# Patient Record
Sex: Male | Born: 1975 | Race: White | Hispanic: No | Marital: Married | Smoking: Never smoker
Health system: Southern US, Community
[De-identification: ages and names within clinical notes are randomized; demographics above are authoritative.]

## PROBLEM LIST (undated history)

## (undated) HISTORY — PX: ANTERIOR CRUCIATE LIGAMENT REPAIR: SHX115

---

## 1994-02-15 HISTORY — PX: ANTERIOR CRUCIATE LIGAMENT REPAIR: SHX115

## 2007-10-03 ENCOUNTER — Emergency Department (HOSPITAL_COMMUNITY): Admission: EM | Admit: 2007-10-03 | Discharge: 2007-10-03 | Payer: Self-pay | Admitting: Emergency Medicine

## 2015-12-25 ENCOUNTER — Emergency Department (HOSPITAL_COMMUNITY): Payer: Self-pay

## 2015-12-25 ENCOUNTER — Emergency Department (HOSPITAL_COMMUNITY)
Admission: EM | Admit: 2015-12-25 | Discharge: 2015-12-25 | Disposition: A | Discharge status: Home / Self Care | Payer: Self-pay | Attending: Emergency Medicine | Admitting: Emergency Medicine

## 2015-12-25 ENCOUNTER — Encounter (HOSPITAL_COMMUNITY): Payer: Self-pay

## 2015-12-25 DIAGNOSIS — Z791 Long term (current) use of non-steroidal anti-inflammatories (NSAID): Secondary | ICD-10-CM | POA: Insufficient documentation

## 2015-12-25 DIAGNOSIS — F17220 Nicotine dependence, chewing tobacco, uncomplicated: Secondary | ICD-10-CM | POA: Insufficient documentation

## 2015-12-25 DIAGNOSIS — Z79899 Other long term (current) drug therapy: Secondary | ICD-10-CM | POA: Insufficient documentation

## 2015-12-25 DIAGNOSIS — R1013 Epigastric pain: Secondary | ICD-10-CM | POA: Insufficient documentation

## 2015-12-25 LAB — URINALYSIS, ROUTINE W REFLEX MICROSCOPIC
BILIRUBIN URINE: NEGATIVE
Glucose, UA: NEGATIVE mg/dL
Ketones, ur: NEGATIVE mg/dL
Leukocytes, UA: NEGATIVE
NITRITE: NEGATIVE
PROTEIN: NEGATIVE mg/dL
pH: 5.5 (ref 5.0–8.0)

## 2015-12-25 LAB — URINE MICROSCOPIC-ADD ON: Bacteria, UA: NONE SEEN

## 2015-12-25 LAB — COMPREHENSIVE METABOLIC PANEL
ALBUMIN: 3.9 g/dL (ref 3.5–5.0)
ALT: 28 U/L (ref 17–63)
ANION GAP: 6 (ref 5–15)
AST: 20 U/L (ref 15–41)
Alkaline Phosphatase: 63 U/L (ref 38–126)
BUN: 21 mg/dL — ABNORMAL HIGH (ref 6–20)
CALCIUM: 9.1 mg/dL (ref 8.9–10.3)
CO2: 28 mmol/L (ref 22–32)
Chloride: 103 mmol/L (ref 101–111)
Creatinine, Ser: 0.94 mg/dL (ref 0.61–1.24)
GFR calc non Af Amer: >60 mL/min (ref >60)
GLUCOSE: 106 mg/dL — AB (ref 65–99)
POTASSIUM: 4 mmol/L (ref 3.5–5.1)
SODIUM: 137 mmol/L (ref 135–145)
Total Bilirubin: 0.4 mg/dL (ref 0.3–1.2)
Total Protein: 7.4 g/dL (ref 6.5–8.1)

## 2015-12-25 LAB — CBC
HEMATOCRIT: 44 % (ref 39.0–52.0)
HEMOGLOBIN: 14.8 g/dL (ref 13.0–17.0)
MCH: 29.8 pg (ref 26.0–34.0)
MCHC: 33.6 g/dL (ref 30.0–36.0)
MCV: 88.5 fL (ref 78.0–100.0)
Platelets: 388 10*3/uL (ref 150–400)
RBC: 4.97 MIL/uL (ref 4.22–5.81)
RDW: 14.1 % (ref 11.5–15.5)
WBC: 13.8 10*3/uL — ABNORMAL HIGH (ref 4.0–10.5)

## 2015-12-25 LAB — POC OCCULT BLOOD, ED: FECAL OCCULT BLD: POSITIVE — AB

## 2015-12-25 LAB — TYPE AND SCREEN
ABO/RH(D): A POS
ANTIBODY SCREEN: NEGATIVE

## 2015-12-25 MED ORDER — IOPAMIDOL (ISOVUE-300) INJECTION 61%
INTRAVENOUS | Status: AC
  Administered 2015-12-25: 30 mL
  Filled 2015-12-25: qty 30

## 2015-12-25 MED ORDER — PANTOPRAZOLE SODIUM 20 MG PO TBEC: 20.0000 mg | DELAYED_RELEASE_TABLET | Freq: Every day | ORAL | 0 refills | Status: AC

## 2015-12-25 MED ORDER — PANTOPRAZOLE SODIUM 40 MG IV SOLR
40.0000 mg | Freq: Once | INTRAVENOUS | Status: AC
  Administered 2015-12-25: 40 mg via INTRAVENOUS
  Filled 2015-12-25: qty 40

## 2015-12-25 MED ORDER — GI COCKTAIL ~~LOC~~
30.0000 mL | Freq: Once | ORAL | Status: AC
  Administered 2015-12-25: 30 mL via ORAL
  Filled 2015-12-25: qty 30

## 2015-12-25 MED ORDER — SUCRALFATE 1 GM/10ML PO SUSP
1.0000 g | Freq: Once | ORAL | Status: AC
  Administered 2015-12-25: 1 g via ORAL
  Filled 2015-12-25: qty 10

## 2015-12-25 MED ORDER — IOPAMIDOL (ISOVUE-300) INJECTION 61%
100.0000 mL | Freq: Once | INTRAVENOUS | Status: AC | PRN
  Administered 2015-12-25: 100 mL via INTRAVENOUS

## 2015-12-25 MED ORDER — FAMOTIDINE IN NACL 20-0.9 MG/50ML-% IV SOLN
20.0000 mg | Freq: Once | INTRAVENOUS | Status: AC
  Administered 2015-12-25: 20 mg via INTRAVENOUS
  Filled 2015-12-25: qty 50

## 2015-12-25 NOTE — ED Notes (Addendum)
RN explained to pt and family rationale for discharge and why he did not meet inpatient criteria. Follow up resources given and pt instructed to call hospital main line and ask to speak with congregational nurse or case management for further assistance.

## 2015-12-25 NOTE — ED Provider Notes (Signed)
[facility] DEPT Provider Note   CSN: [id number] Arrival date & time: 12/25/15  1120     History   Chief Complaint Chief Complaint  Patient presents with  . Abdominal Pain    HPI Johnathan Johnson is a 40 y.o. male.  HPI  Pt was seen at 1450.  Per pt, c/o gradual onset and persistence of constant upper abd "pain" for the past 2+ months, worse over the past 2 weeks. Pt states for the past 1 week he has had multiple intermittent episodes of "diarrhea;" "4-6 stools" per day. States the past 2 days his stools have been "black." States he took "3 left over amoxicillin tablets" for a toothache spread out over the past several weeks. Pt also states he has been "taking a lot of 800mg  motrin" for his dental pain for the past 1-2 weeks.  Denies N/V, no fevers, no back pain, no rash, no CP/SOB, no blood in stools.        History reviewed. No pertinent past medical history.  There are no active problems to display for this patient.   Past Surgical History:  Procedure Laterality Date  . ANTERIOR CRUCIATE LIGAMENT REPAIR        Home Medications    Prior to Admission medications   Medication Sig Start Date End Date Taking? Authorizing Provider  diclofenac (VOLTAREN) 25 MG EC tablet Take 25 mg by mouth 2 (two) times daily.   Yes Historical Provider, MD  HYDROcodone-acetaminophen (NORCO) 7.5-325 MG tablet Take 1 tablet by mouth every 6 (six) hours as needed for moderate pain.   Yes Historical Provider, MD  ibuprofen (ADVIL,MOTRIN) 800 MG tablet Take 800 mg by mouth every 8 (eight) hours as needed.   Yes Historical Provider, MD  naproxen sodium (ANAPROX) 220 MG tablet Take 220 mg by mouth 2 (two) times daily with a meal.   Yes Historical Provider, MD    Family History No family history on file.  Social History Social History  Substance Use Topics  . Smoking status: Never Smoker  . Smokeless tobacco: Current User    Types: Chew  . Alcohol use No     Allergies   Haldol  [haloperidol lactate]   Review of Systems Review of Systems ROS: Statement: All systems negative except as marked or noted in the HPI; Constitutional: Negative for fever and chills. ; ; Eyes: Negative for eye pain, redness and discharge. ; ; ENMT: Negative for ear pain, hoarseness, nasal congestion, sinus pressure and sore throat. ; ; Cardiovascular: Negative for chest pain, palpitations, diaphoresis, dyspnea and peripheral edema. ; ; Respiratory: Negative for cough, wheezing and stridor. ; ; Gastrointestinal: +"black stools," diarrhea, abd pain. Negative for nausea, vomiting, blood in stool, hematemesis, jaundice and rectal bleeding. . ; ; Genitourinary: Negative for dysuria, flank pain and hematuria. ; ; Musculoskeletal: Negative for back pain and neck pain. Negative for swelling and trauma.; ; Skin: Negative for pruritus, rash, abrasions, blisters, bruising and skin lesion.; ; Neuro: Negative for headache, lightheadedness and neck stiffness. Negative for weakness, altered level of consciousness, altered mental status, extremity weakness, paresthesias, involuntary movement, seizure and syncope.       Physical Exam Updated Vital Signs BP 154/92   Pulse 100   Temp 98.5 F (36.9 C)   Resp 16   Ht 5\' 11"  (1.803 m)   Wt 250 lb (113.4 kg)   SpO2 99%   BMI 34.87 kg/m   Physical Exam 1455: Physical examination:  Nursing notes reviewed; Vital signs  and O2 SAT reviewed;  Constitutional: Well developed, Well nourished, Well hydrated, In no acute distress; Head:  Normocephalic, atraumatic; Eyes: EOMI, PERRL, No scleral icterus; ENMT: Mouth and pharynx normal, Mucous membranes moist; Neck: Supple, Full range of motion, No lymphadenopathy; Cardiovascular: Regular rate and rhythm, No murmur, rub, or gallop; Respiratory: Breath sounds clear & equal bilaterally, No rales, rhonchi, wheezes.  Speaking full sentences with ease, Normal respiratory effort/excursion; Chest: Nontender, Movement normal; Abdomen:  Soft, +mid-epigastric tenderness to palp. No rebound or guarding. Nondistended, Normal bowel sounds. Rectal exam performed w/permission of pt and ED RN chaperone present.  Anal tone normal.  Non-tender, soft brown stool in rectal vault, heme positive.  No fissures, no external hemorrhoids, no palp masses.; Genitourinary: No CVA tenderness; Extremities: Pulses normal, No tenderness, No edema, No calf edema or asymmetry.; Neuro: AA&Ox3, Major CN grossly intact.  Speech clear. No gross focal motor or sensory deficits in extremities.; Skin: Color normal, Warm, Dry.   ED Treatments / Results  Labs (all labs ordered are listed, but only abnormal results are displayed)   EKG  EKG Interpretation None       Radiology   Procedures Procedures (including critical care time)  Medications Ordered in ED Medications  famotidine (PEPCID) IVPB 20 mg premix (not administered)  pantoprazole (PROTONIX) injection 40 mg (not administered)     Initial Impression / Assessment and Plan / ED Course  I have reviewed the triage vital signs and the nursing notes.  Pertinent labs & imaging results that were available during my care of the patient were reviewed by me and considered in my medical decision making (see chart for details).  MDM Reviewed: previous chart, nursing note and vitals Reviewed previous: labs Interpretation: labs, x-ray and CT scan    Results for orders placed or performed during the hospital encounter of 12/25/15  Comprehensive metabolic panel  Result Value Ref Range   Sodium 137 135 - 145 mmol/L   Potassium 4.0 3.5 - 5.1 mmol/L   Chloride 103 101 - 111 mmol/L   CO2 28 22 - 32 mmol/L   Glucose, Bld 106 (H) 65 - 99 mg/dL   BUN 21 (H) 6 - 20 mg/dL   Creatinine, Ser 1.61 0.61 - 1.24 mg/dL   Calcium 9.1 8.9 - 09.6 mg/dL   Total Protein 7.4 6.5 - 8.1 g/dL   Albumin 3.9 3.5 - 5.0 g/dL   AST 20 15 - 41 U/L   ALT 28 17 - 63 U/L   Alkaline Phosphatase 63 38 - 126 U/L   Total  Bilirubin 0.4 0.3 - 1.2 mg/dL   GFR calc non Af Amer >60 >60 mL/min   GFR calc Af Amer >60 >60 mL/min   Anion gap 6 5 - 15  CBC  Result Value Ref Range   WBC 13.8 (H) 4.0 - 10.5 K/uL   RBC 4.97 4.22 - 5.81 MIL/uL   Hemoglobin 14.8 13.0 - 17.0 g/dL   HCT 04.5 40.9 - 81.1 %   MCV 88.5 78.0 - 100.0 fL   MCH 29.8 26.0 - 34.0 pg   MCHC 33.6 30.0 - 36.0 g/dL   RDW 91.4 78.2 - 95.6 %   Platelets 388 150 - 400 K/uL  Urinalysis, Routine w reflex microscopic (not at [facility])  Result Value Ref Range   Color, Urine YELLOW YELLOW   APPearance CLEAR CLEAR   Specific Gravity, Urine >1.030 (H) 1.005 - 1.030   pH 5.5 5.0 - 8.0   Glucose, UA NEGATIVE NEGATIVE  mg/dL   Hgb urine dipstick TRACE (A) NEGATIVE   Bilirubin Urine NEGATIVE NEGATIVE   Ketones, ur NEGATIVE NEGATIVE mg/dL   Protein, ur NEGATIVE NEGATIVE mg/dL   Nitrite NEGATIVE NEGATIVE   Leukocytes, UA NEGATIVE NEGATIVE  Urine microscopic-add on  Result Value Ref Range   Squamous Epithelial / LPF 0-5 (A) NONE SEEN   WBC, UA 0-5 0 - 5 WBC/hpf   RBC / HPF 0-5 0 - 5 RBC/hpf   Bacteria, UA NONE SEEN NONE SEEN  POC occult blood, ED  Result Value Ref Range   Fecal Occult Bld POSITIVE (A) NEGATIVE  Type and screen [facility]  Result Value Ref Range   ABO/RH(D) A POS    Antibody Screen NEG    Sample Expiration 12/28/2015    Dg Chest 2 View Result Date: 12/25/2015 CLINICAL DATA:  Abdominal pain. EXAM: CHEST  2 VIEW COMPARISON:  No recent prior. FINDINGS: Mediastinum and hilar structures are normal. No focal infiltrate. No pleural effusion pneumothorax. Heart size normal. No acute bony abnormality. IMPRESSION: No acute cardiopulmonary disease. Electronically Signed   By: Maisie Fushomas  Register   On: 12/25/2015 16:00   Ct Abdomen Pelvis W Contrast Result Date: 12/25/2015 CLINICAL DATA:  Upper abdominal pain for 1 month EXAM: CT ABDOMEN AND PELVIS WITH CONTRAST TECHNIQUE: Multidetector CT imaging of the abdomen and pelvis was performed using  the standard protocol following bolus administration of intravenous contrast. CONTRAST:  30mL ISOVUE-300 IOPAMIDOL (ISOVUE-300) INJECTION 61%, 100mL ISOVUE-300 IOPAMIDOL (ISOVUE-300) INJECTION 61% COMPARISON:  None. FINDINGS: Lower chest: No acute abnormality. Hepatobiliary: Few scattered cysts are noted within the liver. The gallbladder is within normal limits. Pancreas: Unremarkable. No pancreatic ductal dilatation or surrounding inflammatory changes. Spleen: Normal in size without focal abnormality. Adrenals/Urinary Tract: Adrenal glands are unremarkable. Kidneys are normal, without renal calculi, focal lesion, or hydronephrosis. Bladder is unremarkable. Stomach/Bowel: Stomach is within normal limits. Appendix appears normal. No evidence of bowel wall thickening, distention, or inflammatory changes. Vascular/Lymphatic: Aortic atherosclerosis. No enlarged abdominal or pelvic lymph nodes. Reproductive: Prostate is unremarkable. Other: No abdominal wall hernia or abnormality. No abdominopelvic ascites. Musculoskeletal: No acute or significant osseous findings. IMPRESSION: No acute abnormality noted. Electronically Signed   By: Alcide CleverMark  Lukens M.D.   On: 12/25/2015 16:26    1715:  Stool heme positive, H/H and rest of workup reassuring. Tx symptomatically, f/u GI MD. Dx and testing d/w pt and family.  Questions answered.  Verb understanding, agreeable to d/c home with outpt f/u.    Final Clinical Impressions(s) / ED Diagnoses   Final diagnoses:  None    New Prescriptions New Prescriptions   No medications on file     Samuel JesterKathleen Ark Agrusa, DO 12/28/15 1834

## 2015-12-25 NOTE — ED Triage Notes (Signed)
Pt reports upper abd pain for the past month.  Reports was on amoxicillin 2 weeks ago for a tooth infection and started having diarrhea for the past week.  Reports has had 4-6 loose stools per day and reports black stools for the past 2 days.

## 2015-12-25 NOTE — Discharge Instructions (Signed)
Eat a bland diet, avoiding greasy, fatty, fried foods, as well as spicy and acidic foods or beverages.  Avoid eating within the hour or 2 before going to bed or laying down.  Avoid aspirin and ibuprofen products. Also avoid teas, colas, coffee, chocolate, pepermint and spearment.  Take over the counter pepcid, one or two tablets by mouth twice a day, for the next 3 to 4 weeks.  May also take over the counter maalox/mylanta, as directed on packaging, as needed for discomfort.  Take the prescription as directed.  Call the GI doctor tomorrow to schedule a follow up appointment within the next week.  Return to the Emergency Department immediately if worsening.

## 2021-01-22 ENCOUNTER — Emergency Department (HOSPITAL_COMMUNITY): Payer: Self-pay

## 2021-01-22 ENCOUNTER — Other Ambulatory Visit: Payer: Self-pay

## 2021-01-22 ENCOUNTER — Encounter (HOSPITAL_COMMUNITY): Payer: Self-pay | Admitting: Emergency Medicine

## 2021-01-22 ENCOUNTER — Inpatient Hospital Stay (HOSPITAL_COMMUNITY): Payer: Self-pay

## 2021-01-22 ENCOUNTER — Inpatient Hospital Stay (HOSPITAL_COMMUNITY)
Admission: EM | Admit: 2021-01-22 | Discharge: 2021-01-25 | DRG: 872 | Disposition: A | Discharge status: Home / Self Care | Payer: Self-pay | Attending: Family Medicine | Admitting: Family Medicine

## 2021-01-22 DIAGNOSIS — L0390 Cellulitis, unspecified: Secondary | ICD-10-CM | POA: Diagnosis present

## 2021-01-22 DIAGNOSIS — S83249A Other tear of medial meniscus, current injury, unspecified knee, initial encounter: Secondary | ICD-10-CM

## 2021-01-22 DIAGNOSIS — Z79899 Other long term (current) drug therapy: Secondary | ICD-10-CM

## 2021-01-22 DIAGNOSIS — R6510 Systemic inflammatory response syndrome (SIRS) of non-infectious origin without acute organ dysfunction: Secondary | ICD-10-CM

## 2021-01-22 DIAGNOSIS — K5900 Constipation, unspecified: Secondary | ICD-10-CM | POA: Diagnosis present

## 2021-01-22 DIAGNOSIS — D649 Anemia, unspecified: Secondary | ICD-10-CM

## 2021-01-22 DIAGNOSIS — L02416 Cutaneous abscess of left lower limb: Secondary | ICD-10-CM | POA: Diagnosis present

## 2021-01-22 DIAGNOSIS — F17210 Nicotine dependence, cigarettes, uncomplicated: Secondary | ICD-10-CM | POA: Diagnosis present

## 2021-01-22 DIAGNOSIS — E441 Mild protein-calorie malnutrition: Secondary | ICD-10-CM | POA: Diagnosis present

## 2021-01-22 DIAGNOSIS — Z888 Allergy status to other drugs, medicaments and biological substances status: Secondary | ICD-10-CM

## 2021-01-22 DIAGNOSIS — K219 Gastro-esophageal reflux disease without esophagitis: Secondary | ICD-10-CM | POA: Diagnosis present

## 2021-01-22 DIAGNOSIS — Z8249 Family history of ischemic heart disease and other diseases of the circulatory system: Secondary | ICD-10-CM

## 2021-01-22 DIAGNOSIS — A419 Sepsis, unspecified organism: Principal | ICD-10-CM

## 2021-01-22 DIAGNOSIS — L03116 Cellulitis of left lower limb: Secondary | ICD-10-CM

## 2021-01-22 DIAGNOSIS — E8809 Other disorders of plasma-protein metabolism, not elsewhere classified: Secondary | ICD-10-CM | POA: Diagnosis present

## 2021-01-22 DIAGNOSIS — F17200 Nicotine dependence, unspecified, uncomplicated: Secondary | ICD-10-CM

## 2021-01-22 DIAGNOSIS — Z20822 Contact with and (suspected) exposure to covid-19: Secondary | ICD-10-CM | POA: Diagnosis present

## 2021-01-22 DIAGNOSIS — E871 Hypo-osmolality and hyponatremia: Secondary | ICD-10-CM

## 2021-01-22 LAB — COMPREHENSIVE METABOLIC PANEL
ALT: 12 U/L (ref 0–44)
ALT: 10 U/L (ref 0–44)
AST: 15 U/L (ref 15–41)
AST: 12 U/L — ABNORMAL LOW (ref 15–41)
Albumin: 3.2 g/dL — ABNORMAL LOW (ref 3.5–5.0)
Albumin: 2.6 g/dL — ABNORMAL LOW (ref 3.5–5.0)
Alkaline Phosphatase: 64 U/L (ref 38–126)
Alkaline Phosphatase: 52 U/L (ref 38–126)
Anion gap: 10 (ref 5–15)
Anion gap: 6 (ref 5–15)
BUN: 14 mg/dL (ref 6–20)
BUN: 12 mg/dL (ref 6–20)
CO2: 24 mmol/L (ref 22–32)
CO2: 27 mmol/L (ref 22–32)
Calcium: 8 mg/dL — ABNORMAL LOW (ref 8.9–10.3)
Calcium: 7.8 mg/dL — ABNORMAL LOW (ref 8.9–10.3)
Chloride: 98 mmol/L (ref 98–111)
Chloride: 105 mmol/L (ref 98–111)
Creatinine, Ser: 0.88 mg/dL (ref 0.61–1.24)
Creatinine, Ser: 0.95 mg/dL (ref 0.61–1.24)
GFR, Estimated: >60 mL/min (ref >60)
GFR, Estimated: >60 mL/min (ref >60)
Glucose, Bld: 131 mg/dL — ABNORMAL HIGH (ref 70–99)
Glucose, Bld: 126 mg/dL — ABNORMAL HIGH (ref 70–99)
Potassium: 3.7 mmol/L (ref 3.5–5.1)
Potassium: 3.6 mmol/L (ref 3.5–5.1)
Sodium: 132 mmol/L — ABNORMAL LOW (ref 135–145)
Sodium: 138 mmol/L (ref 135–145)
Total Bilirubin: 1.2 mg/dL (ref 0.3–1.2)
Total Bilirubin: 1.1 mg/dL (ref 0.3–1.2)
Total Protein: 7.1 g/dL (ref 6.5–8.1)
Total Protein: 5.9 g/dL — ABNORMAL LOW (ref 6.5–8.1)

## 2021-01-22 LAB — CBC WITH DIFFERENTIAL/PLATELET
Abs Immature Granulocytes: 0.1 10*3/uL — ABNORMAL HIGH (ref 0.00–0.07)
Abs Immature Granulocytes: 0.14 10*3/uL — ABNORMAL HIGH (ref 0.00–0.07)
Basophils Absolute: 0.1 10*3/uL (ref 0.0–0.1)
Basophils Absolute: 0.1 10*3/uL (ref 0.0–0.1)
Basophils Relative: 0 %
Basophils Relative: 0 %
Eosinophils Absolute: 0 10*3/uL (ref 0.0–0.5)
Eosinophils Absolute: 0 10*3/uL (ref 0.0–0.5)
Eosinophils Relative: 0 %
Eosinophils Relative: 0 %
HCT: 35.5 % — ABNORMAL LOW (ref 39.0–52.0)
HCT: 31.3 % — ABNORMAL LOW (ref 39.0–52.0)
Hemoglobin: 11.6 g/dL — ABNORMAL LOW (ref 13.0–17.0)
Hemoglobin: 9.8 g/dL — ABNORMAL LOW (ref 13.0–17.0)
Immature Granulocytes: 1 %
Immature Granulocytes: 1 %
Lymphocytes Relative: 12 %
Lymphocytes Relative: 13 %
Lymphs Abs: 2.3 10*3/uL (ref 0.7–4.0)
Lymphs Abs: 2.4 10*3/uL (ref 0.7–4.0)
MCH: 28.6 pg (ref 26.0–34.0)
MCH: 27.8 pg (ref 26.0–34.0)
MCHC: 32.7 g/dL (ref 30.0–36.0)
MCHC: 31.3 g/dL (ref 30.0–36.0)
MCV: 87.7 fL (ref 80.0–100.0)
MCV: 88.9 fL (ref 80.0–100.0)
Monocytes Absolute: 1.4 10*3/uL — ABNORMAL HIGH (ref 0.1–1.0)
Monocytes Absolute: 1.7 10*3/uL — ABNORMAL HIGH (ref 0.1–1.0)
Monocytes Relative: 8 %
Monocytes Relative: 10 %
Neutro Abs: 15.2 10*3/uL — ABNORMAL HIGH (ref 1.7–7.7)
Neutro Abs: 13.7 10*3/uL — ABNORMAL HIGH (ref 1.7–7.7)
Neutrophils Relative %: 79 %
Neutrophils Relative %: 76 %
Platelets: 304 10*3/uL (ref 150–400)
Platelets: 230 10*3/uL (ref 150–400)
RBC: 4.05 MIL/uL — ABNORMAL LOW (ref 4.22–5.81)
RBC: 3.52 MIL/uL — ABNORMAL LOW (ref 4.22–5.81)
RDW: 13.2 % (ref 11.5–15.5)
RDW: 13.1 % (ref 11.5–15.5)
WBC: 19.1 10*3/uL — ABNORMAL HIGH (ref 4.0–10.5)
WBC: 18 10*3/uL — ABNORMAL HIGH (ref 4.0–10.5)
nRBC: 0 % (ref 0.0–0.2)
nRBC: 0 % (ref 0.0–0.2)

## 2021-01-22 LAB — RESP PANEL BY RT-PCR (FLU A&B, COVID) ARPGX2
Influenza A by PCR: NEGATIVE
Influenza B by PCR: NEGATIVE
SARS Coronavirus 2 by RT PCR: NEGATIVE

## 2021-01-22 LAB — PROCALCITONIN: Procalcitonin: 0.28 ng/mL

## 2021-01-22 LAB — CORTISOL-AM, BLOOD: Cortisol - AM: 18.6 ug/dL (ref 6.7–22.6)

## 2021-01-22 LAB — URINALYSIS, ROUTINE W REFLEX MICROSCOPIC
Bilirubin Urine: NEGATIVE
Glucose, UA: NEGATIVE mg/dL
Ketones, ur: NEGATIVE mg/dL
Leukocytes,Ua: NEGATIVE
Nitrite: NEGATIVE
Protein, ur: NEGATIVE mg/dL
Specific Gravity, Urine: 1.01 (ref 1.005–1.030)
pH: 6 (ref 5.0–8.0)

## 2021-01-22 LAB — PROTIME-INR
INR: 1.3 — ABNORMAL HIGH (ref 0.8–1.2)
INR: 1.4 — ABNORMAL HIGH (ref 0.8–1.2)
Prothrombin Time: 15.8 seconds — ABNORMAL HIGH (ref 11.4–15.2)
Prothrombin Time: 16.9 seconds — ABNORMAL HIGH (ref 11.4–15.2)

## 2021-01-22 LAB — URINALYSIS, MICROSCOPIC (REFLEX)
Bacteria, UA: NONE SEEN
Squamous Epithelial / HPF: NONE SEEN (ref 0–5)

## 2021-01-22 LAB — MAGNESIUM: Magnesium: 1.8 mg/dL (ref 1.7–2.4)

## 2021-01-22 LAB — APTT: aPTT: 33 seconds (ref 24–36)

## 2021-01-22 LAB — HIV ANTIBODY (ROUTINE TESTING W REFLEX): HIV Screen 4th Generation wRfx: NONREACTIVE

## 2021-01-22 LAB — LACTIC ACID, PLASMA: Lactic Acid, Venous: 1 mmol/L (ref 0.5–1.9)

## 2021-01-22 MED ORDER — PANTOPRAZOLE SODIUM 40 MG PO TBEC
40.0000 mg | DELAYED_RELEASE_TABLET | Freq: Every day | ORAL | Status: DC
  Administered 2021-01-22 – 2021-01-25 (×3): 40 mg via ORAL
  Filled 2021-01-22 (×5): qty 1

## 2021-01-22 MED ORDER — MORPHINE SULFATE (PF) 4 MG/ML IV SOLN
4.0000 mg | Freq: Once | INTRAVENOUS | Status: AC
  Administered 2021-01-22: 4 mg via INTRAVENOUS
  Filled 2021-01-22: qty 1

## 2021-01-22 MED ORDER — ACETAMINOPHEN 325 MG PO TABS: 650.0000 mg | ORAL_TABLET | Freq: Once | ORAL | Status: DC

## 2021-01-22 MED ORDER — ACETAMINOPHEN 325 MG PO TABS
650.0000 mg | ORAL_TABLET | Freq: Once | ORAL | Status: AC | PRN
  Administered 2021-01-22: 650 mg via ORAL
  Filled 2021-01-22: qty 2

## 2021-01-22 MED ORDER — SODIUM CHLORIDE 0.9 % IV SOLN
2.0000 g | Freq: Once | INTRAVENOUS | Status: AC
  Administered 2021-01-22: 2 g via INTRAVENOUS
  Filled 2021-01-22: qty 20

## 2021-01-22 MED ORDER — ONDANSETRON HCL 4 MG PO TABS: 4.0000 mg | ORAL_TABLET | Freq: Four times a day (QID) | ORAL | Status: DC | PRN

## 2021-01-22 MED ORDER — VANCOMYCIN HCL 1750 MG/350ML IV SOLN
1750.0000 mg | Freq: Once | INTRAVENOUS | Status: AC
  Administered 2021-01-22: 1750 mg via INTRAVENOUS
  Filled 2021-01-22: qty 350

## 2021-01-22 MED ORDER — POLYETHYLENE GLYCOL 3350 17 G PO PACK
17.0000 g | PACK | Freq: Every day | ORAL | Status: DC
  Administered 2021-01-22: 10:00:00 17 g via ORAL
  Filled 2021-01-22 (×5): qty 1

## 2021-01-22 MED ORDER — ACETAMINOPHEN 650 MG RE SUPP: 650.0000 mg | Freq: Four times a day (QID) | RECTAL | Status: DC | PRN

## 2021-01-22 MED ORDER — LACTATED RINGERS IV BOLUS (SEPSIS)
1000.0000 mL | Freq: Once | INTRAVENOUS | Status: AC
  Administered 2021-01-22: 1000 mL via INTRAVENOUS

## 2021-01-22 MED ORDER — VANCOMYCIN HCL 1500 MG/300ML IV SOLN
1500.0000 mg | Freq: Two times a day (BID) | INTRAVENOUS | Status: DC
  Administered 2021-01-22 – 2021-01-25 (×6): 1500 mg via INTRAVENOUS
  Filled 2021-01-22 (×6): qty 300

## 2021-01-22 MED ORDER — LIDOCAINE HCL (PF) 1 % IJ SOLN
5.0000 mL | Freq: Once | INTRAMUSCULAR | Status: AC
  Administered 2021-01-22: 5 mL
  Filled 2021-01-22: qty 30

## 2021-01-22 MED ORDER — VANCOMYCIN HCL 1000 MG/200ML IV SOLN: 1000.0000 mg | Freq: Once | INTRAVENOUS | Status: DC

## 2021-01-22 MED ORDER — KETOROLAC TROMETHAMINE 30 MG/ML IJ SOLN
30.0000 mg | Freq: Once | INTRAMUSCULAR | Status: AC
  Administered 2021-01-22: 30 mg via INTRAVENOUS
  Filled 2021-01-22: qty 1

## 2021-01-22 MED ORDER — HYDROCODONE-ACETAMINOPHEN 7.5-325 MG PO TABS
1.0000 | ORAL_TABLET | Freq: Four times a day (QID) | ORAL | Status: DC | PRN
  Administered 2021-01-22 – 2021-01-24 (×5): 1 via ORAL
  Filled 2021-01-22 (×5): qty 1

## 2021-01-22 MED ORDER — HEPARIN SODIUM (PORCINE) 5000 UNIT/ML IJ SOLN
5000.0000 [IU] | Freq: Three times a day (TID) | INTRAMUSCULAR | Status: DC
  Administered 2021-01-22 – 2021-01-24 (×4): 5000 [IU] via SUBCUTANEOUS
  Filled 2021-01-22 (×6): qty 1

## 2021-01-22 MED ORDER — ACETAMINOPHEN 325 MG PO TABS: 650.0000 mg | ORAL_TABLET | Freq: Four times a day (QID) | ORAL | Status: DC | PRN

## 2021-01-22 MED ORDER — ONDANSETRON HCL 4 MG/2ML IJ SOLN: 4.0000 mg | Freq: Four times a day (QID) | INTRAMUSCULAR | Status: DC | PRN

## 2021-01-22 MED ORDER — SODIUM CHLORIDE 0.9 % IV SOLN
2.0000 g | INTRAVENOUS | Status: DC
  Administered 2021-01-23 – 2021-01-25 (×3): 2 g via INTRAVENOUS
  Filled 2021-01-22 (×3): qty 20

## 2021-01-22 MED ORDER — PANTOPRAZOLE SODIUM 20 MG PO TBEC
20.0000 mg | DELAYED_RELEASE_TABLET | Freq: Every day | ORAL | Status: DC
  Filled 2021-01-22 (×2): qty 1

## 2021-01-22 MED ORDER — GADOBUTROL 1 MMOL/ML IV SOLN
9.0000 mL | Freq: Once | INTRAVENOUS | Status: AC | PRN
  Administered 2021-01-22: 9 mL via INTRAVENOUS

## 2021-01-22 MED ORDER — KETOROLAC TROMETHAMINE 30 MG/ML IJ SOLN
30.0000 mg | Freq: Four times a day (QID) | INTRAMUSCULAR | Status: DC | PRN
  Administered 2021-01-22: 30 mg via INTRAVENOUS
  Filled 2021-01-22: qty 1

## 2021-01-22 MED ORDER — LACTATED RINGERS IV SOLN: INTRAVENOUS | Status: AC

## 2021-01-22 NOTE — Assessment & Plan Note (Addendum)
Present on admission. Secondary to left leg cellulitis. Empiric Vancomycin and Ceftriaxone initiated on admission. Tmax of 103.1 F on admission. Associated leukocytosis which has resolved. Blood culture with no growth at time of discharge.

## 2021-01-22 NOTE — ED Notes (Signed)
Provider at bedside

## 2021-01-22 NOTE — ED Triage Notes (Signed)
Pt arrives POV with spouse. 5 days ago pt had a pimple-like bump on left knee that has since become red, swollen, and painful with an open wound. Redness is noted up pt's groan, around knee and starting down anterior lower leg. Pt is putting neosporin and peroxide on it, keeping it covered with a bandaid. Pt denies fever, n/v/d

## 2021-01-22 NOTE — Assessment & Plan Note (Addendum)
1. Dietitian evaluated with diagnosis of "Increased nutrient needs related to wound healing as evidenced by estimated needs." Dietitian consulted with recommendation for nutrition supplementation and a daily multivitamin.

## 2021-01-22 NOTE — Progress Notes (Signed)
Pharmacy Antibiotic Note  Johnathan Johnson is a 45 y.o. male admitted on 01/22/2021 with  L leg cellulitis .  Pharmacy has been consulted for Vancomycin dosing.  Plan: Vancomycin 1500 mg IV Q 12 hrs. Goal AUC 400-550. Expected AUC: 500, SCr used: 0.88 Will f/u renal function, micro data, and pt's clinical condition Vanc levels prn   Height: 5\' 11"  (180.3 cm) Weight: 86.2 kg (190 lb) IBW/kg (Calculated) : 75.3  Temp (24hrs), Avg:101.2 F (38.4 C), Min:99.5 F (37.5 C), Max:103.1 F (39.5 C)  Recent Labs  Lab 01/22/21 0130 01/22/21 0147  WBC 19.1*  --   CREATININE 0.88  --   LATICACIDVEN  --  1.0    Estimated Creatinine Clearance: 111.7 mL/min (by C-G formula based on SCr of 0.88 mg/dL).    Allergies  Allergen Reactions   Haldol [Haloperidol Lactate]     "body locked up."     Antimicrobials this admission: 12/8 Rocephin x 1 12/8  Vanc >>   Microbiology results: 12/8 BCx:  12/8 UCx:    Thank you for allowing pharmacy to be a part of this patient's care.  14/8, PharmD, BCPS Please see amion for complete clinical pharmacist phone list 01/22/2021 3:48 AM

## 2021-01-22 NOTE — Assessment & Plan Note (Addendum)
Continue Protonix °

## 2021-01-22 NOTE — Assessment & Plan Note (Addendum)
Left leg. Initial concern for abscess. I&D performed in ED with drainage sent for culture and no growth so far. MRI obtained which showed a medially located left upper knee fluid collection. Orthopedic surgery consulted and patient underwent successful I&D in the OR on 12/10. Wound culture significant for MRSA, sensitive to doxycycline. Patient transitioned from Vancomycin/Ceftriaxone to doxycycline on discharge.

## 2021-01-22 NOTE — ED Notes (Signed)
Registration with pt at bedside

## 2021-01-22 NOTE — ED Notes (Signed)
Pt given urinal at bedside.

## 2021-01-22 NOTE — Hospital Course (Addendum)
Johnathan Johnson is a 46 y.o. male with a history of tobacco use and GERD. Patient presented secondary to knee pain and found to have evidence of cellulitis and associated sepsis on admission. Empiric antibiotics started and cultures obtained. Clinically improving. MRI suggests developing abscess. Orthopedic surgery consulted and plan for I&D in the OR on 12/10.

## 2021-01-22 NOTE — Progress Notes (Addendum)
Johnathan Johnson, Johnathan Pcp Per (Inactive)   Brief Narrative: Johnathan Johnson is a 45 y.o. male with a history of tobacco use and GERD. Johnson presented secondary to knee pain and found to have evidence of cellulitis and associated sepsis on admission. Empiric antibiotics Johnson and cultures obtained.   Assessment & Plan:   * Sepsis (HCC) Present on admission. Secondary to left leg cellulitis. Empiric Vancomycin and Ceftriaxone initiated on admission. Blood cultures obtained and are pending. Tmax of 103.1 F on admission. Associated leukocytosis. -Continue Vancomycin/Ceftriaxone and follow up blood cultures -CBC in AM  Cellulitis Left leg. Initial concern for abscess. I&D performed in ED with drainage sent for culture. -Antibiotics as mentioned above -Follow-up wound culture  Tobacco use disorder Counseled on admission. Declined nicotine patch  Mild protein-calorie malnutrition (HCC) Unsure of diagnosis -Dietitian consult  GERD (gastroesophageal reflux disease) -Continue Protonix    DVT prophylaxis: Heparin subq Code Status:   Code Status: Full Code Family Communication: None at bedside Disposition Plan: Discharge home likely in 2-3 days pending culture data, transition to oral antibiotics   Consultants:  None  Procedures:  I&D (12/8)  Antimicrobials: Vancomycin Ceftriaxone    Subjective: Johnathan significant issue overnight. Tired.   Objective: Vitals:   01/22/21 0400 01/22/21 0404 01/22/21 0429 01/22/21 0510  BP: 133/68  125/77 126/73  Pulse: 97 98 96 91  Resp: (!) 7 20 19 16   Temp:   98.7 F (37.1 C) 98.6 F (37 C)  TempSrc:      SpO2: 96% 97% 100% 99%  Weight:   86.2 kg   Height:   5\' 11"  (1.803 m)     Intake/Output Summary (Last 24 hours) at 01/22/2021 1215 Last data filed at 01/22/2021 0900 Gross per 24 hour  Intake 4245.33 ml  Output 1125 ml  Net 3120.33 ml   Filed  Weights   01/22/21 0103 01/22/21 0429  Weight: 86.2 kg 86.2 kg    Examination:  General exam: Appears calm and comfortable Respiratory system: Clear to auscultation. Respiratory effort normal. Cardiovascular system: S1 & S2 heard, RRR. Johnathan murmurs, rubs, gallops or clicks. Gastrointestinal system: Abdomen is nondistended, soft and nontender. Johnathan organomegaly or masses felt. Normal bowel sounds heard. Central nervous system: Alert and oriented. Johnathan focal neurological deficits. Musculoskeletal: Johnathan edema. Johnathan calf tenderness Skin: Johnathan cyanosis. Left knee wrapped in bandage. Anterior/medial aspect of distal thigh with erythema. Left calf with warmth and mild tenderness Psychiatry: Judgement and insight appear normal. Mood & affect appropriate.     Data Reviewed: I have personally reviewed following labs and imaging studies  CBC Lab Results  Component Value Date   WBC 18.0 (H) 01/22/2021   RBC 3.52 (L) 01/22/2021   HGB 9.8 (L) 01/22/2021   HCT 31.3 (L) 01/22/2021   MCV 88.9 01/22/2021   MCH 27.8 01/22/2021   PLT 230 01/22/2021   MCHC 31.3 01/22/2021   RDW 13.1 01/22/2021   LYMPHSABS 2.4 01/22/2021   MONOABS 1.7 (H) 01/22/2021   EOSABS 0.0 01/22/2021   BASOSABS 0.1 01/22/2021     Last metabolic panel Lab Results  Component Value Date   NA 138 01/22/2021   K 3.6 01/22/2021   CL 105 01/22/2021   CO2 27 01/22/2021   BUN 12 01/22/2021   CREATININE 0.95 01/22/2021   GLUCOSE 126 (H) 01/22/2021   GFRNONAA >60 01/22/2021   GFRAA >60 12/25/2015   CALCIUM 7.8 (L)  01/22/2021   PROT 5.9 (L) 01/22/2021   ALBUMIN 2.6 (L) 01/22/2021   BILITOT 1.1 01/22/2021   ALKPHOS 52 01/22/2021   AST 12 (L) 01/22/2021   ALT 10 01/22/2021   ANIONGAP 6 01/22/2021    CBG (last 3)  Johnathan results for input(s): GLUCAP in the last 72 hours.   GFR: Estimated Creatinine Clearance: 103.5 mL/min (by C-G formula based on SCr of 0.95 mg/dL).  Coagulation Profile: Recent Labs  Lab 01/22/21 0130  01/22/21 0525  INR 1.3* 1.4*    Recent Results (from the past 240 hour(s))  Resp Panel by RT-PCR (Flu A&B, Covid) Nasopharyngeal Swab     Status: None   Collection Time: 01/22/21  1:33 AM   Specimen: Nasopharyngeal Swab; Nasopharyngeal(NP) swabs in vial transport medium  Result Value Ref Range Status   SARS Coronavirus 2 by RT PCR NEGATIVE NEGATIVE Final    Comment: (NOTE) SARS-CoV-2 target nucleic acids are NOT DETECTED.  The SARS-CoV-2 RNA is generally detectable in upper respiratory specimens during the acute phase of infection. The lowest concentration of SARS-CoV-2 viral copies this assay can detect is 138 copies/mL. A negative result does not preclude SARS-Cov-2 infection and should not be used as the sole basis for treatment or other Johnson management decisions. A negative result may occur with  improper specimen collection/handling, submission of specimen other than nasopharyngeal swab, presence of viral mutation(s) within the areas targeted by this assay, and inadequate number of viral copies(<138 copies/mL). A negative result must be combined with clinical observations, Johnson history, and epidemiological information. The expected result is Negative.  Fact Sheet for Patients:  BloggerCourse.com  Fact Sheet for Healthcare Providers:  SeriousBroker.it  This test is Johnathan t yet approved or cleared by the [place] FDA and  has been authorized for detection and/or diagnosis of SARS-CoV-2 by FDA under an Emergency Use Authorization (EUA). This EUA will remain  in effect (meaning this test can be used) for the duration of the COVID-19 declaration under Section 564(b)(1) of the Act, 21 U.S.C.section 360bbb-3(b)(1), unless the authorization is terminated  or revoked sooner.       Influenza A by PCR NEGATIVE NEGATIVE Final   Influenza B by PCR NEGATIVE NEGATIVE Final    Comment: (NOTE) The Xpert Xpress  SARS-CoV-2/FLU/RSV plus assay is intended as an aid in the diagnosis of influenza from Nasopharyngeal swab specimens and should not be used as a sole basis for treatment. Nasal washings and aspirates are unacceptable for Xpert Xpress SARS-CoV-2/FLU/RSV testing.  Fact Sheet for Patients: BloggerCourse.com  Fact Sheet for Healthcare Providers: SeriousBroker.it  This test is not yet approved or cleared by the [place] FDA and has been authorized for detection and/or diagnosis of SARS-CoV-2 by FDA under an Emergency Use Authorization (EUA). This EUA will remain in effect (meaning this test can be used) for the duration of the COVID-19 declaration under Section 564(b)(1) of the Act, 21 U.S.C. section 360bbb-3(b)(1), unless the authorization is terminated or revoked.  Performed at [facility], [address] [place], [place] [address]   Blood Culture (routine x 2)     Status: None (Preliminary result)   Collection Time: 01/22/21  1:47 AM   Specimen: Blood  Result Value Ref Range Status   Specimen Description RIGHT ANTECUBITAL  Final   Special Requests   Final    BOTTLES DRAWN AEROBIC AND ANAEROBIC Blood Culture adequate volume Performed at [facility], [address] [place], [place] [address]    Culture PENDING  Incomplete  Report Status PENDING  Incomplete  Blood Culture (routine x 2)     Status: None (Preliminary result)   Collection Time: 01/22/21  1:47 AM   Specimen: Blood  Result Value Ref Range Status   Specimen Description LEFT ANTECUBITAL  Final   Special Requests   Final    BOTTLES DRAWN AEROBIC AND ANAEROBIC Blood Culture results may not be optimal due to an excessive volume of blood received in culture bottles Performed at [facility], [address] [place], [place] [address]    Culture PENDING  Incomplete   Report Status PENDING  Incomplete  Aerobic Culture w Gram Stain (superficial specimen)     Status:  None (Preliminary result)   Collection Time: 01/22/21  2:14 AM   Specimen: KNEE  Result Value Ref Range Status   Specimen Description   Final    KNEE Performed at [facility], [address] [place], [place] [address]    Special Requests   Final    NONE Performed at [facility], [address] [place], [place] [address]    Gram Stain   Final    RARE WBC PRESENT, PREDOMINANTLY PMN Johnathan ORGANISMS SEEN Performed at [facility], [address] [place], [place] [address]    Culture PENDING  Incomplete   Report Status PENDING  Incomplete        Radiology Studies: DG Knee 2 Views Left  Result Date: 01/22/2021 CLINICAL DATA:  Pimple like bump on left knee with increased redness, initial encounter EXAM: LEFT KNEE - 2 VIEW COMPARISON:  None. FINDINGS: Johnathan acute fracture or dislocation is noted. The known soft tissue lesion is noted just above the patella on the lateral film. Johnathan bony erosive changes are seen. IMPRESSION: Changes consistent with the known soft tissue abnormality. Johnathan bony abnormality is seen. Electronically Signed   By: Alcide Clever M.D.   On: 01/22/2021 02:18   DG Chest Port 1 View  Result Date: 01/22/2021 CLINICAL DATA:  Possible sepsis EXAM: PORTABLE CHEST 1 VIEW COMPARISON:  12/25/2015 FINDINGS: The heart size and mediastinal contours are within normal limits. Both lungs are clear. The visualized skeletal structures are unremarkable. IMPRESSION: Johnathan active disease. Electronically Signed   By: Alcide Clever M.D.   On: 01/22/2021 02:17        Scheduled Meds:  acetaminophen  650 mg Oral Once   heparin  5,000 Units Subcutaneous Q8H   pantoprazole  40 mg Oral Daily   polyethylene glycol  17 g Oral Daily   Continuous Infusions:  [START ON 01/23/2021] cefTRIAXone (ROCEPHIN)  IV     lactated ringers 150 mL/hr at 01/22/21 0453   vancomycin       LOS: 0 days     Jacquelin Hawking, MD [facility] 01/22/2021, 12:15 PM  If 7PM-7AM, please contact  night-coverage [email or web address]

## 2021-01-22 NOTE — H&P (Signed)
TRH H&P    Patient Demographics:    Johnathan Johnson, is a 45 y.o. male  MRN: [id number]  DOB - [birth year]  Admit Date - 01/22/2021  Referring MD/NP/PA: Preston Fleeting  Outpatient Primary MD for the patient is Patient, No Pcp Per (Inactive)  Patient coming from: home  Chief complaint- knee pain   HPI:    Johnathan Johnson  is a 45 y.o. male, with history of tobacco use disorder and acid reflux presents ED with a chief complaint of knee pain.  Patient reports that he had a tiny bump on his knee approximately 6 days ago.  He mash on it twice, with a small amount of purulent drainage.  3 days later he noticed erythema and edema of the knee.  Reports that onset of pain was also 3 days ago.  He describes the pain as burning and throbbing.  The pain is constant, but worse with weightbearing and bending his leg.  He tried ibuprofen but it did not relieve the pain.  He denies any fever, chills, sweats at home.  He was febrile when he came into the ED.  Patient reports otherwise he has been in his normal state of health.  He does admit to some constipation.  His last normal bowel movement was yesterday.  He normally goes every day. He reports that lately its been every other day.  He reports he has been drinking plenty of water, but does not eat much fiber in his diet.  He is not on any opiate at home.  Patient has normal appetite.  He reports 1 episode of emesis that was related to pain when expressing fluid from the knee yesterday.  He reports he got a decent amount of purulent fluid but cannot quantify how much.  He reports that today when he came into the ER the wound was draining.  The color of these different drainages have been golden, green, and brown.  Patient has no other complaints at this time.  Patient is a current smoker a pack a day.  He declines nicotine patch at this time.  He does not drink alcohol.  He does not use illicit  drugs.  He is vaccinated for COVID.  Patient is full code.  The ED Temp as high as 103.1, down to 99 5, heart rate 112-123, respiratory rate 12, blood pressure 136/84, satting 94% Leukocytosis with a white blood cell count of 19.1, hemoglobin 11.6, platelets 304 Chemistry unremarkable Albumin 3.2 Lactic acid 1.0 COVID and flu pending Blood cultures pending ED provider did attempt a I&D but only expressed serosanguineous fluid.  This fluid was sent for culture Chest x-ray shows no active disease X-ray knee left shows soft tissue abnormality without bony abnormality EKG shows a heart rate of 123, sinus tachycardia, QTC 460 Tylenol given x2, Rocephin, Toradol, morphine, 3 L of LR, 150 MLS per hour of LR, and vancomycin given in the ED Admission requested for cellulitis    Review of systems:    In addition to the HPI above,  No Fever-chills, No Headache,  No changes with Vision or hearing, No problems swallowing food or Liquids, No Chest pain, Cough or Shortness of Breath, No Abdominal pain, No Nausea or Vomiting, No Blood in stool or Urine, No dysuria, No new skin rashes or bruises, No new joints pains-aches,  No new weakness, tingling, numbness in any extremity, No recent weight gain or loss, No polyuria, polydypsia or polyphagia, No significant Mental Stressors.  All other systems reviewed and are negative.    Past History of the following :    History reviewed. No pertinent past medical history.    Past Surgical History:  Procedure Laterality Date   ANTERIOR CRUCIATE LIGAMENT REPAIR     ANTERIOR CRUCIATE LIGAMENT REPAIR Right 1996      Social History:      Social History   Tobacco Use   Smoking status: Never   Smokeless tobacco: Current    Types: Chew  Substance Use Topics   Alcohol use: Yes    Comment: drink a beer occasionally       Family History :    History reviewed. No pertinent family history. Family history hypertension   Home Medications:    Prior to Admission medications   Medication Sig Start Date End Date Taking? Authorizing Provider  diclofenac (VOLTAREN) 25 MG EC tablet Take 25 mg by mouth 2 (two) times daily.    [provider]  HYDROcodone-acetaminophen (NORCO) 7.5-325 MG tablet Take 1 tablet by mouth every 6 (six) hours as needed for moderate pain.    [provider]  ibuprofen (ADVIL,MOTRIN) 800 MG tablet Take 800 mg by mouth every 8 (eight) hours as needed.    [provider]  naproxen sodium (ANAPROX) 220 MG tablet Take 220 mg by mouth 2 (two) times daily with a meal.    [provider]  pantoprazole (PROTONIX) 20 MG tablet Take 1 tablet (20 mg total) by mouth daily. 12/25/15   Samuel Jester, DO     Allergies:     Allergies  Allergen Reactions   Haldol [Haloperidol Lactate]     "body locked up."      Physical Exam:   Vitals  Blood pressure 139/73, pulse (!) 107, temperature 99.5 F (37.5 C), temperature source Oral, resp. rate 16, height 5\' 11"  (1.803 m), weight 86.2 kg, SpO2 96 %.   1.  General: Patient lying supine in bed,  no acute distress   2. Psychiatric: Alert and oriented x 3, mood and behavior normal for situation, pleasant and cooperative with exam   3. Neurologic: Speech and language are normal, face is symmetric, moves all 4 extremities voluntarily, at baseline without acute deficits on limited exam   4. HEENMT:  Head is atraumatic, normocephalic, pupils reactive to light, neck is supple, trachea is midline, mucous membranes are moist   5. Respiratory : Lungs are clear to auscultation bilaterally without wheezing, rhonchi, rales, no cyanosis, no increase in work of breathing or accessory muscle use   6. Cardiovascular : Heart rate tachycardic, rhythm is regular, no murmurs, rubs or gallops, no peripheral edema, peripheral pulses palpated   7. Gastrointestinal:  Abdomen is soft, nondistended, nontender to palpation bowel sounds active, no  masses or organomegaly palpated   8. Skin:  Left knee is wrapped after I&D, above and below the knee is erythematous and edematous, with tenderness to palpation   9.Musculoskeletal:  No acute deformities or trauma, no asymmetry in tone, no peripheral edema, peripheral pulses palpated,     Data Review:  CBC Recent Labs  Lab 01/22/21 0130  WBC 19.1*  HGB 11.6*  HCT 35.5*  PLT 304  MCV 87.7  MCH 28.6  MCHC 32.7  RDW 13.2  LYMPHSABS 2.3  MONOABS 1.4*  EOSABS 0.0  BASOSABS 0.1   ------------------------------------------------------------------------------------------------------------------  Results for orders placed or performed during the hospital encounter of 01/22/21 (from the past 48 hour(s))  Comprehensive metabolic panel     Status: Abnormal   Collection Time: 01/22/21  1:30 AM  Result Value Ref Range   Sodium 132 (L) 135 - 145 mmol/L   Potassium 3.7 3.5 - 5.1 mmol/L   Chloride 98 98 - 111 mmol/L   CO2 24 22 - 32 mmol/L   Glucose, Bld 131 (H) 70 - 99 mg/dL    Comment: Glucose reference range applies only to samples taken after fasting for at least 8 hours.   BUN 14 6 - 20 mg/dL   Creatinine, Ser 5.40 0.61 - 1.24 mg/dL   Calcium 8.0 (L) 8.9 - 10.3 mg/dL   Total Protein 7.1 6.5 - 8.1 g/dL   Albumin 3.2 (L) 3.5 - 5.0 g/dL   AST 15 15 - 41 U/L   ALT 12 0 - 44 U/L   Alkaline Phosphatase 64 38 - 126 U/L   Total Bilirubin 1.2 0.3 - 1.2 mg/dL   GFR, Estimated >98 >11 mL/min    Comment: (NOTE) Calculated using the CKD-EPI Creatinine Equation (2021)    Anion gap 10 5 - 15    Comment: Performed at [facility], [address], [place], [place] [address]  CBC WITH DIFFERENTIAL     Status: Abnormal   Collection Time: 01/22/21  1:30 AM  Result Value Ref Range   WBC 19.1 (H) 4.0 - 10.5 K/uL   RBC 4.05 (L) 4.22 - 5.81 MIL/uL   Hemoglobin 11.6 (L) 13.0 - 17.0 g/dL   HCT 29.5 (L) 62.1 - 30.8 %   MCV 87.7 80.0 - 100.0 fL   MCH 28.6 26.0 - 34.0 pg   MCHC 32.7 30.0 -  36.0 g/dL   RDW 65.7 84.6 - 96.2 %   Platelets 304 150 - 400 K/uL   nRBC 0.0 0.0 - 0.2 %   Neutrophils Relative % 79 %   Neutro Abs 15.2 (H) 1.7 - 7.7 K/uL   Lymphocytes Relative 12 %   Lymphs Abs 2.3 0.7 - 4.0 K/uL   Monocytes Relative 8 %   Monocytes Absolute 1.4 (H) 0.1 - 1.0 K/uL   Eosinophils Relative 0 %   Eosinophils Absolute 0.0 0.0 - 0.5 K/uL   Basophils Relative 0 %   Basophils Absolute 0.1 0.0 - 0.1 K/uL   Immature Granulocytes 1 %   Abs Immature Granulocytes 0.10 (H) 0.00 - 0.07 K/uL    Comment: Performed at [facility], [address], [place], [place] [address]  Protime-INR     Status: Abnormal   Collection Time: 01/22/21  1:30 AM  Result Value Ref Range   Prothrombin Time 15.8 (H) 11.4 - 15.2 seconds   INR 1.3 (H) 0.8 - 1.2    Comment: (NOTE) INR goal varies based on device and disease states. Performed at [facility], [address] [place], [place] [address]   APTT     Status: None   Collection Time: 01/22/21  1:30 AM  Result Value Ref Range   aPTT 33 24 - 36 seconds    Comment: Performed at [facility], [address] [place], [place] [address]  Resp Panel  by RT-PCR (Flu A&B, Covid) Nasopharyngeal Swab     Status: None   Collection Time: 01/22/21  1:33 AM   Specimen: Nasopharyngeal Swab; Nasopharyngeal(NP) swabs in vial transport medium  Result Value Ref Range   SARS Coronavirus 2 by RT PCR NEGATIVE NEGATIVE    Comment: (NOTE) SARS-CoV-2 target nucleic acids are NOT DETECTED.  The SARS-CoV-2 RNA is generally detectable in upper respiratory specimens during the acute phase of infection. The lowest concentration of SARS-CoV-2 viral copies this assay can detect is 138 copies/mL. A negative result does not preclude SARS-Cov-2 infection and should not be used as the sole basis for treatment or other patient management decisions. A negative result may occur with  improper specimen collection/handling, submission of specimen other than nasopharyngeal swab,  presence of viral mutation(s) within the areas targeted by this assay, and inadequate number of viral copies(<138 copies/mL). A negative result must be combined with clinical observations, patient history, and epidemiological information. The expected result is Negative.  Fact Sheet for Patients:  BloggerCourse.com  Fact Sheet for Healthcare Providers:  SeriousBroker.it  This test is no t yet approved or cleared by the [place] FDA and  has been authorized for detection and/or diagnosis of SARS-CoV-2 by FDA under an Emergency Use Authorization (EUA). This EUA will remain  in effect (meaning this test can be used) for the duration of the COVID-19 declaration under Section 564(b)(1) of the Act, 21 U.S.C.section 360bbb-3(b)(1), unless the authorization is terminated  or revoked sooner.       Influenza A by PCR NEGATIVE NEGATIVE   Influenza B by PCR NEGATIVE NEGATIVE    Comment: (NOTE) The Xpert Xpress SARS-CoV-2/FLU/RSV plus assay is intended as an aid in the diagnosis of influenza from Nasopharyngeal swab specimens and should not be used as a sole basis for treatment. Nasal washings and aspirates are unacceptable for Xpert Xpress SARS-CoV-2/FLU/RSV testing.  Fact Sheet for Patients: BloggerCourse.com  Fact Sheet for Healthcare Providers: SeriousBroker.it  This test is not yet approved or cleared by the [place] FDA and has been authorized for detection and/or diagnosis of SARS-CoV-2 by FDA under an Emergency Use Authorization (EUA). This EUA will remain in effect (meaning this test can be used) for the duration of the COVID-19 declaration under Section 564(b)(1) of the Act, 21 U.S.C. section 360bbb-3(b)(1), unless the authorization is terminated or revoked.  Performed at [facility], [address] [place], [place] [address]   Lactic acid, plasma     Status: None    Collection Time: 01/22/21  1:47 AM  Result Value Ref Range   Lactic Acid, Venous 1.0 0.5 - 1.9 mmol/L    Comment: Performed at [facility], [address] [place], [place] [address]  Blood Culture (routine x 2)     Status: None (Preliminary result)   Collection Time: 01/22/21  1:47 AM   Specimen: Blood  Result Value Ref Range   Specimen Description RIGHT ANTECUBITAL    Special Requests      BOTTLES DRAWN AEROBIC AND ANAEROBIC Blood Culture adequate volume Performed at [facility], [address] [place], [place] [address]    Culture PENDING    Report Status PENDING   Blood Culture (routine x 2)     Status: None (Preliminary result)   Collection Time: 01/22/21  1:47 AM   Specimen: Blood  Result Value Ref Range   Specimen Description LEFT ANTECUBITAL    Special Requests      BOTTLES DRAWN AEROBIC AND ANAEROBIC Blood Culture results  may not be optimal due to an excessive volume of blood received in culture bottles Performed at [facility], [address], [place], [place] [address]    Culture PENDING    Report Status PENDING   Urinalysis, Routine w reflex microscopic Urine, Clean Catch     Status: Abnormal   Collection Time: 01/22/21  2:53 AM  Result Value Ref Range   Color, Urine YELLOW YELLOW   APPearance CLEAR CLEAR   Specific Gravity, Urine 1.010 1.005 - 1.030   pH 6.0 5.0 - 8.0   Glucose, UA NEGATIVE NEGATIVE mg/dL   Hgb urine dipstick TRACE (A) NEGATIVE   Bilirubin Urine NEGATIVE NEGATIVE   Ketones, ur NEGATIVE NEGATIVE mg/dL   Protein, ur NEGATIVE NEGATIVE mg/dL   Nitrite NEGATIVE NEGATIVE   Leukocytes,Ua NEGATIVE NEGATIVE    Comment: Performed at [facility], [address] [place], [place] [address]  Urinalysis, Microscopic (reflex)     Status: None   Collection Time: 01/22/21  2:53 AM  Result Value Ref Range   RBC / HPF 0-5 0 - 5 RBC/hpf   WBC, UA 0-5 0 - 5 WBC/hpf   Bacteria, UA NONE SEEN NONE SEEN   Squamous Epithelial / LPF NONE SEEN 0 - 5   Mucus  PRESENT     Comment: Performed at [facility], [address], [place], [place] [address]    Chemistries  Recent Labs  Lab 01/22/21 0130  NA 132*  K 3.7  CL 98  CO2 24  GLUCOSE 131*  BUN 14  CREATININE 0.88  CALCIUM 8.0*  AST 15  ALT 12  ALKPHOS 64  BILITOT 1.2   ------------------------------------------------------------------------------------------------------------------  ------------------------------------------------------------------------------------------------------------------ GFR: Estimated Creatinine Clearance: 111.7 mL/min (by C-G formula based on SCr of 0.88 mg/dL). Liver Function Tests: Recent Labs  Lab 01/22/21 0130  AST 15  ALT 12  ALKPHOS 64  BILITOT 1.2  PROT 7.1  ALBUMIN 3.2*   No results for input(s): LIPASE, AMYLASE in the last 168 hours. No results for input(s): AMMONIA in the last 168 hours. Coagulation Profile: Recent Labs  Lab 01/22/21 0130  INR 1.3*   Cardiac Enzymes: No results for input(s): CKTOTAL, CKMB, CKMBINDEX, TROPONINI in the last 168 hours. BNP (last 3 results) No results for input(s): PROBNP in the last 8760 hours. HbA1C: No results for input(s): HGBA1C in the last 72 hours. CBG: No results for input(s): GLUCAP in the last 168 hours. Lipid Profile: No results for input(s): CHOL, HDL, LDLCALC, TRIG, CHOLHDL, LDLDIRECT in the last 72 hours. Thyroid Function Tests: No results for input(s): TSH, T4TOTAL, FREET4, T3FREE, THYROIDAB in the last 72 hours. Anemia Panel: No results for input(s): VITAMINB12, FOLATE, FERRITIN, TIBC, IRON, RETICCTPCT in the last 72 hours.  --------------------------------------------------------------------------------------------------------------- Urine analysis:    Component Value Date/Time   COLORURINE YELLOW 01/22/2021 0253   APPEARANCEUR CLEAR 01/22/2021 0253   LABSPEC 1.010 01/22/2021 0253   PHURINE 6.0 01/22/2021 0253   GLUCOSEU NEGATIVE 01/22/2021 0253   HGBUR TRACE (A)  01/22/2021 0253   BILIRUBINUR NEGATIVE 01/22/2021 0253   KETONESUR NEGATIVE 01/22/2021 0253   PROTEINUR NEGATIVE 01/22/2021 0253   NITRITE NEGATIVE 01/22/2021 0253   LEUKOCYTESUR NEGATIVE 01/22/2021 0253      Imaging Results:    DG Knee 2 Views Left  Result Date: 01/22/2021 CLINICAL DATA:  Pimple like bump on left knee with increased redness, initial encounter EXAM: LEFT KNEE - 2 VIEW COMPARISON:  None. FINDINGS: No acute fracture or dislocation is noted. The known soft tissue lesion  is noted just above the patella on the lateral film. No bony erosive changes are seen. IMPRESSION: Changes consistent with the known soft tissue abnormality. No bony abnormality is seen. Electronically Signed   By: Alcide Clever M.D.   On: 01/22/2021 02:18   DG Chest Port 1 View  Result Date: 01/22/2021 CLINICAL DATA:  Possible sepsis EXAM: PORTABLE CHEST 1 VIEW COMPARISON:  12/25/2015 FINDINGS: The heart size and mediastinal contours are within normal limits. Both lungs are clear. The visualized skeletal structures are unremarkable. IMPRESSION: No active disease. Electronically Signed   By: Alcide Clever M.D.   On: 01/22/2021 02:17       Assessment & Plan:    Principal Problem:   SIRS (systemic inflammatory response syndrome) (HCC) Active Problems:   Cellulitis   GERD (gastroesophageal reflux disease)   Mild protein-calorie malnutrition (HCC)   SIRS Temp 103.1, white blood cell count 19.1, heart rate 123 No endorgan damage, no lactic acidosis Secondary to cellulitis Blood culture pending, continue Rocephin and vancomycin Cellulitis Erythema and edema of left lower extremity SIRS criteria as above Continue Rocephin and vancomycin Blood cultures pending Serosanguineous fluid from I&D sent for culture The knee is significantly edematous, MRI knee in the a.m. Continue to monitor GERD Continue Protonix Mild protein calorie malnutrition Encourage nutrient dense p.o. intake Tobacco use  disorder Counseled on the importance of cessation Patient declines nicotine patch at this time   DVT Prophylaxis-   Heparin - SCDs   AM Labs Ordered, also please review Full Orders  Family Communication: Admission, patients condition and plan of care including tests being ordered have been discussed with the patient and wife who indicate understanding and agree with the plan and Code Status.  Code Status: Full  Admission status: Inpatient :The appropriate admission status for this patient is INPATIENT. Inpatient status is judged to be reasonable and necessary in order to provide the required intensity of service to ensure the patient's safety. The patient's presenting symptoms, physical exam findings, and initial radiographic and laboratory data in the context of their chronic comorbidities is felt to place them at high risk for further clinical deterioration. Furthermore, it is not anticipated that the patient will be medically stable for discharge from the hospital within 2 midnights of admission. The following factors support the admission status of inpatient.     The patient's presenting symptoms include knee pain. The worrisome physical exam findings include erythema, edema left lower extremity with tachycardia and fever. The initial radiographic and laboratory data are worrisome because of leukocytosis. The chronic co-morbidities include acid reflux and tobacco use disorder.       * I certify that at the point of admission it is my clinical judgment that the patient will require inpatient hospital care spanning beyond 2 midnights from the point of admission due to high intensity of service, high risk for further deterioration and high frequency of surveillance required.*  Disposition: Anticipated Discharge date 48 hours discharge to home  Time spent in minutes : 65   Leotis Isham B Zierle-Ghosh DO

## 2021-01-22 NOTE — Assessment & Plan Note (Signed)
Counseled on admission. Declined nicotine patch

## 2021-01-22 NOTE — ED Provider Notes (Signed)
[facility] Provider Note   CSN: [id number] Arrival date & time: 01/22/21  0045     History Chief Complaint  Patient presents with   Knee Pain   knee infection    [facility] is a 45 y.o. male.  The history is provided by the patient.  Knee Pain He noted a pimple on his left knee about 5 days ago and popped it.  Since then, he has had progressive redness of the knee which is spread to both the thigh and calf.  There has been some slight drainage.  He was not aware of any fever and denies chills or sweats.  He has noted that he is felt somewhat drained and weak.  He has not done anything else to treat his symptoms.   History reviewed. No pertinent past medical history.  There are no problems to display for this patient.   Past Surgical History:  Procedure Laterality Date   ANTERIOR CRUCIATE LIGAMENT REPAIR     ANTERIOR CRUCIATE LIGAMENT REPAIR Right 1996       History reviewed. No pertinent family history.  Social History   Tobacco Use   Smoking status: Never   Smokeless tobacco: Current    Types: Chew  Substance Use Topics   Alcohol use: Yes    Comment: drink a beer occasionally   Drug use: No    Home Medications Prior to Admission medications   Medication Sig Start Date End Date Taking? Authorizing Provider  diclofenac (VOLTAREN) 25 MG EC tablet Take 25 mg by mouth 2 (two) times daily.    [provider]  HYDROcodone-acetaminophen (NORCO) 7.5-325 MG tablet Take 1 tablet by mouth every 6 (six) hours as needed for moderate pain.    [provider]  ibuprofen (ADVIL,MOTRIN) 800 MG tablet Take 800 mg by mouth every 8 (eight) hours as needed.    [provider]  naproxen sodium (ANAPROX) 220 MG tablet Take 220 mg by mouth 2 (two) times daily with a meal.    [provider]  pantoprazole (PROTONIX) 20 MG tablet Take 1 tablet (20 mg total) by mouth daily. 12/25/15   Samuel Jester, DO    Allergies     Haldol [haloperidol lactate]  Review of Systems   Review of Systems  All other systems reviewed and are negative.  Physical Exam Updated Vital Signs BP (!) 141/90 (BP Location: Right Arm)   Pulse (!) 137   Temp (!) 103.1 F (39.5 C) (Oral)   Resp 20   Ht 5\' 11"  (1.803 m)   Wt 86.2 kg   SpO2 95%   BMI 26.50 kg/m   Physical Exam Vitals and nursing note reviewed.  45 year old male, resting comfortably and in no acute distress. Vital signs are significant for elevated temperature, heart rate, and borderline elevated blood pressure. Oxygen saturation is 95%, which is normal. Head is normocephalic and atraumatic. PERRLA, EOMI. Oropharynx is clear. Neck is nontender and supple without adenopathy or JVD. Back is nontender and there is no CVA tenderness. Lungs are clear without rales, wheezes, or rhonchi. Chest is nontender. Heart has regular rate and rhythm without murmur. Abdomen is soft, flat, nontender. Extremities: Erythema and soft tissue swelling and induration present over the anterior medial aspect of the left knee extending into the thigh and calf.  An area of mild desquamation noted just lateral to the patella with some serous drainage present. Skin is warm and dry without rash. Neurologic: Mental status is normal,  cranial nerves are intact, moves all extremities equally.  ED Results / Procedures / Treatments   Labs (all labs ordered are listed, but only abnormal results are displayed) Labs Reviewed  COMPREHENSIVE METABOLIC PANEL - Abnormal; Notable for the following components:      Result Value   Sodium 132 (*)    Glucose, Bld 131 (*)    Calcium 8.0 (*)    Albumin 3.2 (*)    All other components within normal limits  CBC WITH DIFFERENTIAL/PLATELET - Abnormal; Notable for the following components:   WBC 19.1 (*)    RBC 4.05 (*)    Hemoglobin 11.6 (*)    HCT 35.5 (*)    Neutro Abs 15.2 (*)    Monocytes Absolute 1.4 (*)    Abs Immature Granulocytes 0.10 (*)     All other components within normal limits  PROTIME-INR - Abnormal; Notable for the following components:   Prothrombin Time 15.8 (*)    INR 1.3 (*)    All other components within normal limits  CULTURE, BLOOD (ROUTINE X 2)  CULTURE, BLOOD (ROUTINE X 2)  RESP PANEL BY RT-PCR (FLU A&B, COVID) ARPGX2  URINE CULTURE  AEROBIC/ANAEROBIC CULTURE W GRAM STAIN (SURGICAL/DEEP WOUND)  LACTIC ACID, PLASMA  APTT  LACTIC ACID, PLASMA  URINALYSIS, ROUTINE W REFLEX MICROSCOPIC    EKG EKG Interpretation  Date/Time:  Thursday January 22 2021 02:13:20 EST Ventricular Rate:  123 PR Interval:  141 QRS Duration: 98 QT Interval:  321 QTC Calculation: 460 R Axis:   70 Text Interpretation: Sinus tachycardia Abnormal R-wave progression, early transition No old tracing to compare Confirmed by Dione Booze ([id number]) on 01/22/2021 2:25:46 AM  Radiology DG Knee 2 Views Left  Result Date: 01/22/2021 CLINICAL DATA:  Pimple like bump on left knee with increased redness, initial encounter EXAM: LEFT KNEE - 2 VIEW COMPARISON:  None. FINDINGS: No acute fracture or dislocation is noted. The known soft tissue lesion is noted just above the patella on the lateral film. No bony erosive changes are seen. IMPRESSION: Changes consistent with the known soft tissue abnormality. No bony abnormality is seen. Electronically Signed   By: Alcide Clever M.D.   On: 01/22/2021 02:18   DG Chest Port 1 View  Result Date: 01/22/2021 CLINICAL DATA:  Possible sepsis EXAM: PORTABLE CHEST 1 VIEW COMPARISON:  12/25/2015 FINDINGS: The heart size and mediastinal contours are within normal limits. Both lungs are clear. The visualized skeletal structures are unremarkable. IMPRESSION: No active disease. Electronically Signed   By: Alcide Clever M.D.   On: 01/22/2021 02:17    Procedures Ultrasound ED Soft Tissue  Date/Time: 01/22/2021 1:35 AM Performed by: Dione Booze, MD Authorized by: Dione Booze, MD   Procedure details:    Indications:  localization of abscess     Transverse view:  Visualized   Longitudinal view:  Visualized   Images: archived   Location:    Location: lower extremity     Side:  Left Findings:     abscess present    cellulitis present .Marland KitchenIncision and Drainage  Date/Time: 01/22/2021 2:12 AM Performed by: Dione Booze, MD Authorized by: Dione Booze, MD   Consent:    Consent obtained:  Verbal   Consent given by:  Patient   Risks, benefits, and alternatives were discussed: yes     Risks discussed:  Bleeding, pain and incomplete drainage   Alternatives discussed:  No treatment Universal protocol:    Procedure explained and questions answered to patient or proxy's satisfaction:  yes     Relevant documents present and verified: yes     Test results available : yes     Imaging studies available: yes     Required blood products, implants, devices, and special equipment available: yes     Site/side marked: yes     Immediately prior to procedure, a time out was called: yes     Patient identity confirmed:  Verbally with patient and arm band Location:    Type:  Abscess   Size:  8 cm   Location:  Lower extremity   Lower extremity location:  Knee   Knee location:  L knee Pre-procedure details:    Skin preparation:  Antiseptic wash Sedation:    Sedation type:  None Anesthesia:    Anesthesia method:  Local infiltration   Local anesthetic:  Lidocaine 1% w/o epi Procedure type:    Complexity:  Complex Procedure details:    Ultrasound guidance: yes     Needle aspiration: no     Incision types:  Cruciate   Incision depth:  Subcutaneous   Wound management:  Probed and deloculated   Drainage:  Serosanguinous   Drainage amount:  Scant   Wound treatment:  Wound left open   Packing materials:  None Post-procedure details:    Procedure completion:  Tolerated well, no immediate complications   CRITICAL CARE Performed by: Dione Booze Total critical care time: 45 minutes Critical care time was exclusive  of separately billable procedures and treating other patients. Critical care was necessary to treat or prevent imminent or life-threatening deterioration. Critical care was time spent personally by me on the following activities: development of treatment plan with patient and/or surrogate as well as nursing, discussions with consultants, evaluation of patient's response to treatment, examination of patient, obtaining history from patient or surrogate, ordering and performing treatments and interventions, ordering and review of laboratory studies, ordering and review of radiographic studies, pulse oximetry and re-evaluation of patient's condition.  Medications Ordered in ED Medications  lactated ringers infusion (has no administration in time range)  lactated ringers bolus 1,000 mL (1,000 mLs Intravenous New Bag/Given 01/22/21 0225)    And  lactated ringers bolus 1,000 mL (1,000 mLs Intravenous New Bag/Given 01/22/21 0226)    And  lactated ringers bolus 1,000 mL (1,000 mLs Intravenous New Bag/Given 01/22/21 0209)  acetaminophen (TYLENOL) tablet 650 mg (650 mg Oral Not Given 01/22/21 0152)  vancomycin (VANCOREADY) IVPB 1750 mg/350 mL (1,750 mg Intravenous New Bag/Given 01/22/21 0208)  acetaminophen (TYLENOL) tablet 650 mg (650 mg Oral Given 01/22/21 0117)  cefTRIAXone (ROCEPHIN) 2 g in sodium chloride 0.9 % 100 mL IVPB (0 g Intravenous Stopped 01/22/21 0227)  lidocaine (PF) (XYLOCAINE) 1 % injection 5 mL (5 mLs Infiltration Given 01/22/21 0208)    ED Course  I have reviewed the triage vital signs and the nursing notes.  Pertinent labs & imaging results that were available during my care of the patient were reviewed by me and considered in my medical decision making (see chart for details).   MDM Rules/Calculators/A&P                         Sepsis secondary to cellulitis of the left lower leg.  Ultrasound was obtained confirming presence of an abscess.  Abscess is incised and drained and he is  started on sepsis protocol.  Because of tachycardia, he started on early goal-directed fluids and started on antibiotics.  Old records are reviewed, and he  has no relevant past visits.  Incision and drainage yielded some serous sanguinous fluid but no definite purulence, culture sent.  I suspect that the serosanguineous fluid level is what I saw on the ultrasound.  ECG shows sinus tachycardia, otherwise unremarkable.  Chest x-ray shows no acute changes, knee x-ray shows soft tissue swelling as noted on physical exam but no bony changes and no obvious effusion.  WBC has come back significantly elevated at 19.1 with a left shift.  Mild anemia is present which is new compared with 2017.  Metabolic panel shows mild hyponatremia which is not felt to be clinically significant.  Borderline hyperglycemia is likely secondary to physiologic stress.  Lactate is normal.  Case is discussed with Dr. Carren Rang of [facility], who agrees to admit the patient.  Final Clinical Impression(s) / ED Diagnoses Final diagnoses:  Sepsis due to undetermined organism (HCC)  Cellulitis of left knee  Normochromic normocytic anemia  Hyponatremia    Rx / DC Orders ED Discharge Orders     None        Dione Booze, MD 01/22/21 [phone]

## 2021-01-22 NOTE — Plan of Care (Signed)
  Problem: Education: Goal: Knowledge of General Education information will improve Description: Including pain rating scale, medication(s)/side effects and non-pharmacologic comfort measures Outcome: Progressing   Problem: Health Behavior/Discharge Planning: Goal: Ability to manage health-related needs will improve Outcome: Progressing   Problem: Clinical Measurements: Goal: Will remain free from infection Outcome: Progressing   

## 2021-01-23 DIAGNOSIS — S83249A Other tear of medial meniscus, current injury, unspecified knee, initial encounter: Secondary | ICD-10-CM

## 2021-01-23 DIAGNOSIS — L02416 Cutaneous abscess of left lower limb: Secondary | ICD-10-CM

## 2021-01-23 DIAGNOSIS — L03116 Cellulitis of left lower limb: Secondary | ICD-10-CM

## 2021-01-23 DIAGNOSIS — F17200 Nicotine dependence, unspecified, uncomplicated: Secondary | ICD-10-CM

## 2021-01-23 LAB — CBC
HCT: 31.2 % — ABNORMAL LOW (ref 39.0–52.0)
Hemoglobin: 9.9 g/dL — ABNORMAL LOW (ref 13.0–17.0)
MCH: 28.4 pg (ref 26.0–34.0)
MCHC: 31.7 g/dL (ref 30.0–36.0)
MCV: 89.7 fL (ref 80.0–100.0)
Platelets: 234 10*3/uL (ref 150–400)
RBC: 3.48 MIL/uL — ABNORMAL LOW (ref 4.22–5.81)
RDW: 13.3 % (ref 11.5–15.5)
WBC: 15.2 10*3/uL — ABNORMAL HIGH (ref 4.0–10.5)
nRBC: 0 % (ref 0.0–0.2)

## 2021-01-23 LAB — SURGICAL PCR SCREEN
MRSA, PCR: POSITIVE — AB
Staphylococcus aureus: POSITIVE — AB

## 2021-01-23 LAB — URINE CULTURE: Culture: NO GROWTH

## 2021-01-23 LAB — CREATININE, SERUM
Creatinine, Ser: 0.86 mg/dL (ref 0.61–1.24)
GFR, Estimated: >60 mL/min (ref >60)

## 2021-01-23 LAB — SEDIMENTATION RATE: Sed Rate: 62 mm/hr — ABNORMAL HIGH (ref 0–16)

## 2021-01-23 LAB — C-REACTIVE PROTEIN: CRP: 12.2 mg/dL — ABNORMAL HIGH (ref <1.0)

## 2021-01-23 MED ORDER — ADULT MULTIVITAMIN W/MINERALS CH
1.0000 | ORAL_TABLET | Freq: Every day | ORAL | Status: DC
  Administered 2021-01-23 – 2021-01-25 (×2): 1 via ORAL
  Filled 2021-01-23 (×3): qty 1

## 2021-01-23 MED ORDER — MUPIROCIN 2 % EX OINT
1.0000 "application " | TOPICAL_OINTMENT | Freq: Two times a day (BID) | CUTANEOUS | Status: DC
  Administered 2021-01-23 – 2021-01-25 (×3): 1 via NASAL
  Filled 2021-01-23: qty 22

## 2021-01-23 MED ORDER — ENSURE MAX PROTEIN PO LIQD
11.0000 [oz_av] | Freq: Every day | ORAL | Status: DC
  Administered 2021-01-25: 11 [oz_av] via ORAL

## 2021-01-23 MED ORDER — CHLORHEXIDINE GLUCONATE CLOTH 2 % EX PADS
6.0000 | MEDICATED_PAD | Freq: Every day | CUTANEOUS | Status: DC
  Administered 2021-01-24 – 2021-01-25 (×2): 6 via TOPICAL

## 2021-01-23 NOTE — Progress Notes (Signed)
Patient's MRSA pcr for surgical swab come back positive. Dr. Caleb Popp made aware. Ointment started as protocol states. Patient updated.

## 2021-01-23 NOTE — Consult Note (Signed)
ORTHOPAEDIC CONSULTATION  REQUESTING PHYSICIAN: Mariel Aloe, MD  ASSESSMENT AND PLAN: 45 y.o. male with the following: Left distal thigh abscess  This patient requires inpatient admission to manage this problem appropriately.  - Weight Bearing Status/Activity: Weightbearing as tolerated  - Additional recommended labs/tests: None; ESR/CRP  -VTE Prophylaxis: As needed  - Pain control: As needed  - Follow-up plan: To be determined  -Procedures: Developing abscess requires a bedside I&D versus formal ID in the operating room.  This was discussed with the patient.  He will decide.  If he decides to proceed with a formal I&D in the operating room, he will have to be n.p.o. at midnight.    He will site, and inform you later on this afternoon.  Chief Complaint: Left thigh pain  HPI: [facility] is a 45 y.o. male with left thigh pain, redness and swelling.  Pain started 1-2 weeks ago.  He noticed a small pustule.  He popped this.  Since then, he has had progressively worsening redness, swelling and pain.  He has had a small amount of drainage.  It does affect his range of motion.  He denies fevers or chills.  Upon presentation to the emergency department, he was febrile to 103.  He has felt well otherwise.  Since admission, and IV antibiotics, he notes improvements in his pain, swelling and redness.  However, he has persistent swelling and obvious redness.  He continues to have some drainage from both spontaneous drainage, as well as a small amount of drainage from the incision made in the emergency department I&D.  Cultures have remained negative.  History reviewed. No pertinent past medical history. Past Surgical History:  Procedure Laterality Date   ANTERIOR CRUCIATE LIGAMENT REPAIR     ANTERIOR CRUCIATE LIGAMENT REPAIR Right 1996   Social History   Socioeconomic History   Marital status: Married    Spouse name: Not on file   Number of children: Not on file   Years of  education: Not on file   Highest education level: Not on file  Occupational History   Not on file  Tobacco Use   Smoking status: Never   Smokeless tobacco: Current    Types: Chew  Substance and Sexual Activity   Alcohol use: Yes    Comment: drink a beer occasionally   Drug use: No   Sexual activity: Not on file  Other Topics Concern   Not on file  Social History Narrative   Not on file   Social Determinants of Health   Financial Resource Strain: Not on file  Food Insecurity: Not on file  Transportation Needs: Not on file  Physical Activity: Not on file  Stress: Not on file  Social Connections: Not on file   History reviewed. No pertinent family history. Allergies  Allergen Reactions   Haldol [Haloperidol Lactate]     "body locked up."    Prior to Admission medications   Medication Sig Start Date End Date Taking? Authorizing Provider  pantoprazole (PROTONIX) 20 MG tablet Take 1 tablet (20 mg total) by mouth daily. Patient not taking: Reported on 01/22/2021 12/25/15   Francine Graven, DO   DG Knee 2 Views Left  Result Date: 01/22/2021 CLINICAL DATA:  Pimple like bump on left knee with increased redness, initial encounter EXAM: LEFT KNEE - 2 VIEW COMPARISON:  None. FINDINGS: No acute fracture or dislocation is noted. The known soft tissue lesion is noted just above the patella on the lateral film. No bony  erosive changes are seen. IMPRESSION: Changes consistent with the known soft tissue abnormality. No bony abnormality is seen. Electronically Signed   By: Inez Catalina M.D.   On: 01/22/2021 02:18   MR KNEE LEFT W WO CONTRAST  Result Date: 01/22/2021 CLINICAL DATA:  Septic arthritis EXAM: MRI OF THE LEFT KNEE WITHOUT AND WITH CONTRAST TECHNIQUE: Multiplanar, multisequence MR imaging of the knee was performed. No intravenous contrast was administered. COMPARISON:  Radiographs 01/22/2021 FINDINGS: MENISCI Medial meniscus: Grade 3 oblique tear of the posterior horn and midbody  involving the inferior surface on image 19 series 11, with some mild free edge truncation in the midbody. Lateral meniscus:  Unremarkable LIGAMENTS Cruciates:  Unremarkable Collaterals:  Unremarkable CARTILAGE Patellofemoral: 6 mm non-fragmented osteochondral lesion of the posterior femoral trochlear groove slightly laterally eccentric, with a degenerative subcortical cystic lesion and adjacent overlying chondral edema and chondral fissuring as shown on images 13-14 of series 13 and also on images 15 through 18 of series 9. 3 mm non-fragmented osteochondral lesion inferiorly along the lateral patellar facet with surrounding marrow edema but intact overlying articular cartilage. Medial: Small degenerative subcortical foci of edema along the medial femoral condyle with associated moderate chondral thinning. Marginal spurring. Lateral: 3 by 9 mm sagittally oriented chondral defect centrally along the posterior weight-bearing portion of the lateral femoral condyle, images 17-19 of series 11 and also shown on image 21 series 12. Joint: Small knee effusion with a small amount of synovitis along the suprapatellar bursa. We do not see the degree of synovial enhancement that I would expect for septic joint. Axial T1 weighted images both pre and postcontrast demonstrate bright fluid which is not typical for T1 weighted images and suggest some towards sort of artifact or distortion of the images. The coronal T1 weighted images appear normal with dark fluid in the joint as expected. Popliteal Fossa:  Small Baker's cyst. Extensor Mechanism: There is abnormal edema and enhancement in the more medial portion of the distal vastus medialis as shown for example on image 4 series 13 and image 19 series 10 which could be from muscle tear or myositis. Deeper portions appear spared. Bones:  No additional significant bony findings. Other: Substantial subcutaneous edema and enhancement suggesting cellulitis. Superficial to the vastus  medialis and along the medial margin of the patella we demonstrate a 3.7 by 1.8 by 3.6 cm (volume = 13 cm^3) fluid signal intensity collection with some enhancement along its margins, suspicious for incipient abscess. Portions of this extend only mm deep to the scan, correlate with any fluctuance in this region. IMPRESSION: 1. There is only a small knee effusion with mild synovitis, with relatively thin degree of enhancement along the synovial margin, making septic joint unlikely. However, there is a irregular 13 mm subcutaneous collection just medial to the patella with surrounding infiltrative enhancement in the subcutaneous tissues concerning for incipient subcutaneous abscess. 2. There is also abnormal edema and enhancement in the adjacent superficial portion of distal vastus medialis which could be from muscle strain or myositis. 3. Grade 3 oblique tear posterior horn and midbody medial meniscus. 4. Small osteochondral lesions along the patellofemoral joint and lateral compartment. Mild degenerative chondral thinning. 5. Small Baker's cyst. 6. Substantial subcutaneous edema and enhancement especially anteromedially along the distal thigh, favoring cellulitis. Electronically Signed   By: Van Clines M.D.   On: 01/22/2021 16:26   DG Chest Port 1 View  Result Date: 01/22/2021 CLINICAL DATA:  Possible sepsis EXAM: PORTABLE CHEST 1 VIEW COMPARISON:  12/25/2015 FINDINGS: The heart size and mediastinal contours are within normal limits. Both lungs are clear. The visualized skeletal structures are unremarkable. IMPRESSION: No active disease. Electronically Signed   By: Inez Catalina M.D.   On: 01/22/2021 02:17    Family History Reviewed and non-contributory, no pertinent history of problems with bleeding or anesthesia    Review of Systems Fevers Chills No numbness or tingling No chest pain No shortness of breath No bowel or bladder dysfunction No GI distress No headaches No loss of  consciousness. No loss of vision No loss of appetite     OBJECTIVE  Vitals:Patient Vitals for the past 8 hrs:  BP Temp Temp src Pulse Resp SpO2  01/23/21 0558 (!) 135/91 98.9 F (37.2 C) Oral 81 18 99 %   General: Alert, no acute distress Cardiovascular: Warm extremities noted Respiratory: No cyanosis, no use of accessory musculature GI: No organomegaly, abdomen is soft and non-tender Skin: No lesions in the area of chief complaint other than those listed below in MSK exam.  Neurologic: Sensation intact distally save for the below mentioned MSK exam Psychiatric: Patient is competent for consent with normal mood and affect Lymphatic: No swelling obvious and reported other than the area involved in the exam below Extremities   LLE:       Test Results Imaging MRI of the left knee with accumulating fluid collection overlying the vastus medialis.  Surrounding cellulitis.  No distinct fluid collection at this point.   Labs cbc Recent Labs    01/22/21 0525 01/23/21 0608  WBC 18.0* 15.2*  HGB 9.8* 9.9*  HCT 31.3* 31.2*  PLT 230 234    Labs inflam No results for input(s): CRP in the last 72 hours.  Invalid input(s): ESR  Labs coag Recent Labs    01/22/21 0130 01/22/21 0525  INR 1.3* 1.4*    Recent Labs    01/22/21 0130 01/22/21 0525 01/23/21 0608  NA 132* 138  --   K 3.7 3.6  --   CL 98 105  --   CO2 24 27  --   GLUCOSE 131* 126*  --   BUN 14 12  --   CREATININE 0.88 0.95 0.86  CALCIUM 8.0* 7.8*  --

## 2021-01-23 NOTE — Progress Notes (Addendum)
PROGRESS NOTE    Johnathan Johnson  [id number] DOB: 1975/04/10 DOA: 01/22/2021 PCP: Patient, No Pcp Per (Inactive)   Brief Narrative: Johnathan Johnson is a 45 y.o. male with a history of tobacco use and GERD. Patient presented secondary to knee pain and found to have evidence of cellulitis and associated sepsis on admission. Empiric antibiotics started and cultures obtained.   Assessment & Plan:   * Sepsis ([place]) Present on admission. Secondary to left leg cellulitis. Empiric Vancomycin and Ceftriaxone initiated on admission. Blood cultures obtained and are pending. Tmax of 103.1 F on admission. Associated leukocytosis which is improving. -Continue Vancomycin/Ceftriaxone and follow up blood cultures -CBC in AM  Cellulitis Left leg. Initial concern for abscess. I&D performed in ED with drainage sent for culture and no growth so far. MRI obtained which showed a medially located left upper knee fluid collection. Orthopedic surgery consulted for evaluation and possible I&D if consistent with abscess -Antibiotics as mentioned above -Follow-up wound culture  Medial meniscus tear Unsure of chronicity. Noted on MRI. Seems to be an incidental finding. Patient can follow-up with orthopedic surgery as an outpatient.  Tobacco use disorder Counseled on admission. Declined nicotine patch  Mild protein-calorie malnutrition (HCC) Unsure of diagnosis -Dietitian consult  GERD (gastroesophageal reflux disease) -Continue Protonix     DVT prophylaxis: Heparin subq Code Status:   Code Status: Full Code Family Communication: None at bedside Disposition Plan: Discharge home likely in 24 hours pending culture data, transition to oral antibiotics   Consultants:  Orthopedic surgery  Procedures:  I&D (12/8)  Antimicrobials: Vancomycin Ceftriaxone    Subjective: Feeling much better this morning. Able to ambulate on leg.  Objective: Vitals:   01/22/21 0429 01/22/21 0510 01/22/21 2050  01/23/21 0558  BP: 125/77 126/73 121/83 (!) 135/91  Pulse: 96 91 75 81  Resp: 19 16 18 18   Temp: 98.7 F (37.1 C) 98.6 F (37 C) 98.8 F (37.1 C) 98.9 F (37.2 C)  TempSrc:   Oral Oral  SpO2: 100% 99% 96% 99%  Weight: 86.2 kg     Height: 5\' 11"  (1.803 m)       Intake/Output Summary (Last 24 hours) at 01/23/2021 1201 Last data filed at 01/23/2021 0900 Gross per 24 hour  Intake 2010.39 ml  Output --  Net 2010.39 ml    Filed Weights   01/22/21 0103 01/22/21 0429  Weight: 86.2 kg 86.2 kg    Examination: General exam: Appears calm and comfortable Respiratory system: Clear to auscultation. Respiratory effort normal. Cardiovascular system: S1 & S2 heard, RRR. No murmurs, rubs, gallops or clicks. Gastrointestinal system: Abdomen is nondistended, soft and nontender. No organomegaly or masses felt. Normal bowel sounds heard. Central nervous system: Alert and oriented. No focal neurological deficits. Musculoskeletal: No edema. No calf tenderness Skin: No cyanosis. Left knee and lower medial thigh with erythema and warmth. Left knee tear with no obvious purulent drainage noted. Psychiatry: Judgement and insight appear normal. Mood & affect appropriate.     Data Reviewed: I have personally reviewed following labs and imaging studies  CBC Lab Results  Component Value Date   WBC 15.2 (H) 01/23/2021   RBC 3.48 (L) 01/23/2021   HGB 9.9 (L) 01/23/2021   HCT 31.2 (L) 01/23/2021   MCV 89.7 01/23/2021   MCH 28.4 01/23/2021   PLT 234 01/23/2021   MCHC 31.7 01/23/2021   RDW 13.3 01/23/2021   LYMPHSABS 2.4 01/22/2021   MONOABS 1.7 (H) 01/22/2021   EOSABS 0.0 01/22/2021  BASOSABS 0.1 0000000     Last metabolic panel Lab Results  Component Value Date   NA 138 01/22/2021   K 3.6 01/22/2021   CL 105 01/22/2021   CO2 27 01/22/2021   BUN 12 01/22/2021   CREATININE 0.86 01/23/2021   GLUCOSE 126 (H) 01/22/2021   GFRNONAA >60 01/23/2021   GFRAA >60 12/25/2015   CALCIUM 7.8  (L) 01/22/2021   PROT 5.9 (L) 01/22/2021   ALBUMIN 2.6 (L) 01/22/2021   BILITOT 1.1 01/22/2021   ALKPHOS 52 01/22/2021   AST 12 (L) 01/22/2021   ALT 10 01/22/2021   ANIONGAP 6 01/22/2021    CBG (last 3)  No results for input(s): GLUCAP in the last 72 hours.   GFR: Estimated Creatinine Clearance: 114.3 mL/min (by C-G formula based on SCr of 0.86 mg/dL).  Coagulation Profile: Recent Labs  Lab 01/22/21 0130 01/22/21 0525  INR 1.3* 1.4*     Recent Results (from the past 240 hour(s))  Resp Panel by RT-PCR (Flu A&B, Covid) Nasopharyngeal Swab     Status: None   Collection Time: 01/22/21  1:33 AM   Specimen: Nasopharyngeal Swab; Nasopharyngeal(NP) swabs in vial transport medium  Result Value Ref Range Status   SARS Coronavirus 2 by RT PCR NEGATIVE NEGATIVE Final    Comment: (NOTE) SARS-CoV-2 target nucleic acids are NOT DETECTED.  The SARS-CoV-2 RNA is generally detectable in upper respiratory specimens during the acute phase of infection. The lowest concentration of SARS-CoV-2 viral copies this assay can detect is 138 copies/mL. A negative result does not preclude SARS-Cov-2 infection and should not be used as the sole basis for treatment or other patient management decisions. A negative result may occur with  improper specimen collection/handling, submission of specimen other than nasopharyngeal swab, presence of viral mutation(s) within the areas targeted by this assay, and inadequate number of viral copies(<138 copies/mL). A negative result must be combined with clinical observations, patient history, and epidemiological information. The expected result is Negative.  Fact Sheet for Patients:  EntrepreneurPulse.com.au  Fact Sheet for Healthcare Providers:  IncredibleEmployment.be  This test is no t yet approved or cleared by the [place] FDA and  has been authorized for detection and/or diagnosis of SARS-CoV-2 by FDA under an  Emergency Use Authorization (EUA). This EUA will remain  in effect (meaning this test can be used) for the duration of the COVID-19 declaration under Section 564(b)(1) of the Act, 21 U.S.C.section 360bbb-3(b)(1), unless the authorization is terminated  or revoked sooner.       Influenza A by PCR NEGATIVE NEGATIVE Final   Influenza B by PCR NEGATIVE NEGATIVE Final    Comment: (NOTE) The Xpert Xpress SARS-CoV-2/FLU/RSV plus assay is intended as an aid in the diagnosis of influenza from Nasopharyngeal swab specimens and should not be used as a sole basis for treatment. Nasal washings and aspirates are unacceptable for Xpert Xpress SARS-CoV-2/FLU/RSV testing.  Fact Sheet for Patients: EntrepreneurPulse.com.au  Fact Sheet for Healthcare Providers: IncredibleEmployment.be  This test is not yet approved or cleared by the [place] FDA and has been authorized for detection and/or diagnosis of SARS-CoV-2 by FDA under an Emergency Use Authorization (EUA). This EUA will remain in effect (meaning this test can be used) for the duration of the COVID-19 declaration under Section 564(b)(1) of the Act, 21 U.S.C. section 360bbb-3(b)(1), unless the authorization is terminated or revoked.  Performed at [facility], [address], [place], [place] [address]   Blood Culture (routine x 2)  Status: None (Preliminary result)   Collection Time: 01/22/21  1:47 AM   Specimen: Right Antecubital; Blood  Result Value Ref Range Status   Specimen Description RIGHT ANTECUBITAL  Final   Special Requests   Final    BOTTLES DRAWN AEROBIC AND ANAEROBIC Blood Culture adequate volume   Culture   Final    NO GROWTH 1 DAY Performed at [facility], [address], [place], [place] [address]    Report Status PENDING  Incomplete  Blood Culture (routine x 2)     Status: None (Preliminary result)   Collection Time: 01/22/21  1:47 AM   Specimen: Left Antecubital; Blood   Result Value Ref Range Status   Specimen Description LEFT ANTECUBITAL  Final   Special Requests   Final    BOTTLES DRAWN AEROBIC AND ANAEROBIC Blood Culture results may not be optimal due to an excessive volume of blood received in culture bottles   Culture   Final    NO GROWTH 1 DAY Performed at [facility], [address] [place], [place] [address]    Report Status PENDING  Incomplete  Aerobic Culture w Gram Stain (superficial specimen)     Status: None (Preliminary result)   Collection Time: 01/22/21  2:14 AM   Specimen: KNEE  Result Value Ref Range Status   Specimen Description   Final    KNEE Performed at [facility], [address], [place], [place] [address]    Special Requests   Final    NONE Performed at [facility], [address] [place], [place] [address]    Gram Stain   Final    RARE WBC PRESENT, PREDOMINANTLY PMN NO ORGANISMS SEEN Performed at [facility], [place] [address] [place], [place] [address]    Culture PENDING  Incomplete   Report Status PENDING  Incomplete  Urine Culture     Status: None   Collection Time: 01/22/21  2:53 AM   Specimen: Urine, Clean Catch  Result Value Ref Range Status   Specimen Description   Final    URINE, CLEAN CATCH Performed at [facility], [address] [place], [place] [address]    Special Requests   Final    NONE Performed at [facility], [address], [place], [place] [address]    Culture   Final    NO GROWTH Performed at [facility], [place] [address], [place], [place] [address]    Report Status 01/23/2021 FINAL  Final         Radiology Studies: DG Knee 2 Views Left  Result Date: 01/22/2021 CLINICAL DATA:  Pimple like bump on left knee with increased redness, initial encounter EXAM: LEFT KNEE - 2 VIEW COMPARISON:  None. FINDINGS: No acute fracture or dislocation is noted. The known soft tissue lesion is noted just above the patella on the lateral film. No bony erosive changes are seen.  IMPRESSION: Changes consistent with the known soft tissue abnormality. No bony abnormality is seen. Electronically Signed   By: Inez Catalina M.D.   On: 01/22/2021 02:18   MR KNEE LEFT W WO CONTRAST  Result Date: 01/22/2021 CLINICAL DATA:  Septic arthritis EXAM: MRI OF THE LEFT KNEE WITHOUT AND WITH CONTRAST TECHNIQUE: Multiplanar, multisequence MR imaging of the knee was performed. No intravenous contrast was administered. COMPARISON:  Radiographs 01/22/2021 FINDINGS: MENISCI Medial meniscus: Grade 3 oblique tear of the posterior horn and midbody involving the inferior surface on image 19 series 11, with some mild free edge  truncation in the midbody. Lateral meniscus:  Unremarkable LIGAMENTS Cruciates:  Unremarkable Collaterals:  Unremarkable CARTILAGE Patellofemoral: 6 mm non-fragmented osteochondral lesion of the posterior femoral trochlear groove slightly laterally eccentric, with a degenerative subcortical cystic lesion and adjacent overlying chondral edema and chondral fissuring as shown on images 13-14 of series 13 and also on images 15 through 18 of series 9. 3 mm non-fragmented osteochondral lesion inferiorly along the lateral patellar facet with surrounding marrow edema but intact overlying articular cartilage. Medial: Small degenerative subcortical foci of edema along the medial femoral condyle with associated moderate chondral thinning. Marginal spurring. Lateral: 3 by 9 mm sagittally oriented chondral defect centrally along the posterior weight-bearing portion of the lateral femoral condyle, images 17-19 of series 11 and also shown on image 21 series 12. Joint: Small knee effusion with a small amount of synovitis along the suprapatellar bursa. We do not see the degree of synovial enhancement that I would expect for septic joint. Axial T1 weighted images both pre and postcontrast demonstrate bright fluid which is not typical for T1 weighted images and suggest some towards sort of artifact or  distortion of the images. The coronal T1 weighted images appear normal with dark fluid in the joint as expected. Popliteal Fossa:  Small Baker's cyst. Extensor Mechanism: There is abnormal edema and enhancement in the more medial portion of the distal vastus medialis as shown for example on image 4 series 13 and image 19 series 10 which could be from muscle tear or myositis. Deeper portions appear spared. Bones:  No additional significant bony findings. Other: Substantial subcutaneous edema and enhancement suggesting cellulitis. Superficial to the vastus medialis and along the medial margin of the patella we demonstrate a 3.7 by 1.8 by 3.6 cm (volume = 13 cm^3) fluid signal intensity collection with some enhancement along its margins, suspicious for incipient abscess. Portions of this extend only mm deep to the scan, correlate with any fluctuance in this region. IMPRESSION: 1. There is only a small knee effusion with mild synovitis, with relatively thin degree of enhancement along the synovial margin, making septic joint unlikely. However, there is a irregular 13 mm subcutaneous collection just medial to the patella with surrounding infiltrative enhancement in the subcutaneous tissues concerning for incipient subcutaneous abscess. 2. There is also abnormal edema and enhancement in the adjacent superficial portion of distal vastus medialis which could be from muscle strain or myositis. 3. Grade 3 oblique tear posterior horn and midbody medial meniscus. 4. Small osteochondral lesions along the patellofemoral joint and lateral compartment. Mild degenerative chondral thinning. 5. Small Baker's cyst. 6. Substantial subcutaneous edema and enhancement especially anteromedially along the distal thigh, favoring cellulitis. Electronically Signed   By: Van Clines M.D.   On: 01/22/2021 16:26   DG Chest Port 1 View  Result Date: 01/22/2021 CLINICAL DATA:  Possible sepsis EXAM: PORTABLE CHEST 1 VIEW COMPARISON:   12/25/2015 FINDINGS: The heart size and mediastinal contours are within normal limits. Both lungs are clear. The visualized skeletal structures are unremarkable. IMPRESSION: No active disease. Electronically Signed   By: Inez Catalina M.D.   On: 01/22/2021 02:17        Scheduled Meds:  acetaminophen  650 mg Oral Once   heparin  5,000 Units Subcutaneous Q8H   pantoprazole  40 mg Oral Daily   polyethylene glycol  17 g Oral Daily   Continuous Infusions:  cefTRIAXone (ROCEPHIN)  IV 2 g (01/23/21 0327)   vancomycin 1,500 mg (01/23/21 0405)     LOS:  1 day     Jacquelin Hawking, MD [facility] 01/23/2021, 12:01 PM  If 7PM-7AM, please contact night-coverage [email or web address]

## 2021-01-23 NOTE — Progress Notes (Signed)
Initial Nutrition Assessment  DOCUMENTATION CODES:      INTERVENTION:  -Ensure Max daily (150 kcal, 30 gr protein)  -1 packet Juven BID- to support wound healing   -Multivitamin daily  NUTRITION DIAGNOSIS:   Increased nutrient needs related to wound healing as evidenced by estimated needs.   GOAL:  Patient will meet greater than or equal to 90% of their needs   MONITOR:  PO intake, Diet advancement, Supplement acceptance, Labs, Skin  REASON FOR ASSESSMENT:   Consult Assessment of nutrition requirement/status  ASSESSMENT: Patient is a 45 yo male from home. Lives with spouses. He presents with left thigh pain, febrile, sepsis. History of GERD and tobacco use.  NPO after midnight for incision and drainage left knee wound tomorrow.  Heart Healthy diet and good appetite demonstrated 75-100% x 3 meals. No concerns with appetite or intake prior to admission. Encouraged pt to consume protein each meal and have added high protein supplement to support increased protein needs.   Home diet is regular. Patient works as a Psychologist, occupational.   Patient weight 190 lb range (86.2 kg).   Medications reviewed.   Labs: BMP Latest Ref Rng & Units 01/23/2021 01/22/2021 01/22/2021  Glucose 70 - 99 mg/dL - 884(Z) 660(Y)  BUN 6 - 20 mg/dL - 12 14  Creatinine 3.01 - 1.24 mg/dL 6.01 0.93 2.35  Sodium 135 - 145 mmol/L - 138 132(L)  Potassium 3.5 - 5.1 mmol/L - 3.6 3.7  Chloride 98 - 111 mmol/L - 105 98  CO2 22 - 32 mmol/L - 27 24  Calcium 8.9 - 10.3 mg/dL - 7.8(L) 8.0(L)      NUTRITION - FOCUSED PHYSICAL EXAM:  Flowsheet Row Most Recent Value  Orbital Region No depletion  Upper Arm Region No depletion  Thoracic and Lumbar Region No depletion  Buccal Region No depletion  Temple Region No depletion  Clavicle Bone Region No depletion  Clavicle and Acromion Bone Region No depletion  Scapular Bone Region No depletion  Dorsal Hand No depletion  Patellar Region No depletion  Anterior Thigh Region  No depletion  Posterior Calf Region No depletion  Edema (RD Assessment) Mild  Hair Reviewed  Eyes Reviewed  Mouth Reviewed  Skin Reviewed  Nails Reviewed      Diet Order:   Diet Order             Diet NPO time specified  Diet effective midnight           Diet Heart Room service appropriate? Yes; Fluid consistency: Thin  Diet effective now                   EDUCATION NEEDS:  Education needs have been addressed  Skin:  Skin Assessment: Skin Integrity Issues: Skin Integrity Issues::  (left knee cellulitis)  Last BM:  12/7  Height:   Ht Readings from Last 1 Encounters:  01/22/21 5\' 11"  (1.803 m)    Weight:   Wt Readings from Last 1 Encounters:  01/22/21 86.2 kg    Ideal Body Weight:   78 kg  BMI:  Body mass index is 26.5 kg/m.  Estimated Nutritional Needs:   Kcal:  2200-2400  Protein:  115-120 gr  Fluid:  >2 liters daily   14/08/22 MS,RD,CSG,LDN Contact: Royann Shivers

## 2021-01-23 NOTE — Anesthesia Preprocedure Evaluation (Addendum)
Anesthesia Evaluation  Patient identified by MRN, date of birth, ID band Patient awake    Reviewed: Allergy & Precautions, H&P , NPO status , Patient's Chart, lab work & pertinent test results  Airway Mallampati: II  TM Distance: >3 FB Neck ROM: Full    Dental  (+) Dental Advisory Given, Chipped   Pulmonary neg pulmonary ROS,    Pulmonary exam normal breath sounds clear to auscultation       Cardiovascular Exercise Tolerance: Good negative cardio ROS Normal cardiovascular exam Rhythm:Regular Rate:Normal     Neuro/Psych negative neurological ROS  negative psych ROS   GI/Hepatic Neg liver ROS, GERD  Controlled,  Endo/Other  negative endocrine ROS  Renal/GU negative Renal ROS  negative genitourinary   Musculoskeletal negative musculoskeletal ROS (+)   Abdominal   Peds negative pediatric ROS (+)  Hematology negative hematology ROS (+)   Anesthesia Other Findings Left knee abscess   Reproductive/Obstetrics negative OB ROS                            Anesthesia Physical Anesthesia Plan  ASA: 2  Anesthesia Plan: General   Post-op Pain Management: Dilaudid IV   Induction: Intravenous  PONV Risk Score and Plan: 3 and Ondansetron  Airway Management Planned: LMA  Additional Equipment:   Intra-op Plan:   Post-operative Plan: Extubation in OR  Informed Consent: I have reviewed the patients History and Physical, chart, labs and discussed the procedure including the risks, benefits and alternatives for the proposed anesthesia with the patient or authorized representative who has indicated his/her understanding and acceptance.     Dental advisory given  Plan Discussed with: CRNA and Surgeon  Anesthesia Plan Comments:         Anesthesia Quick Evaluation

## 2021-01-23 NOTE — Assessment & Plan Note (Signed)
Unsure of chronicity. Noted on MRI. Seems to be an incidental finding. Patient can follow-up with orthopedic surgery as an outpatient.

## 2021-01-24 ENCOUNTER — Inpatient Hospital Stay (HOSPITAL_COMMUNITY): Payer: Self-pay | Admitting: Anesthesiology

## 2021-01-24 ENCOUNTER — Encounter (HOSPITAL_COMMUNITY)
Admission: EM | Disposition: A | Discharge status: Home / Self Care | Payer: Self-pay | Source: Home / Self Care | Attending: Family Medicine

## 2021-01-24 DIAGNOSIS — K219 Gastro-esophageal reflux disease without esophagitis: Secondary | ICD-10-CM

## 2021-01-24 DIAGNOSIS — E8809 Other disorders of plasma-protein metabolism, not elsewhere classified: Secondary | ICD-10-CM

## 2021-01-24 HISTORY — PX: INCISION AND DRAINAGE: SHX5863

## 2021-01-24 LAB — CBC
HCT: 31.7 % — ABNORMAL LOW (ref 39.0–52.0)
Hemoglobin: 10.2 g/dL — ABNORMAL LOW (ref 13.0–17.0)
MCH: 28.9 pg (ref 26.0–34.0)
MCHC: 32.2 g/dL (ref 30.0–36.0)
MCV: 89.8 fL (ref 80.0–100.0)
Platelets: 272 10*3/uL (ref 150–400)
RBC: 3.53 MIL/uL — ABNORMAL LOW (ref 4.22–5.81)
RDW: 13.5 % (ref 11.5–15.5)
WBC: 10 10*3/uL (ref 4.0–10.5)
nRBC: 0 % (ref 0.0–0.2)

## 2021-01-24 LAB — C-REACTIVE PROTEIN: CRP: 14.6 mg/dL — ABNORMAL HIGH (ref <1.0)

## 2021-01-24 LAB — SEDIMENTATION RATE: Sed Rate: 76 mm/hr — ABNORMAL HIGH (ref 0–16)

## 2021-01-24 SURGERY — INCISION AND DRAINAGE
Anesthesia: General | Laterality: Left

## 2021-01-24 MED ORDER — PROPOFOL 10 MG/ML IV BOLUS
INTRAVENOUS | Status: DC | PRN
  Administered 2021-01-24: 200 mg via INTRAVENOUS

## 2021-01-24 MED ORDER — ONDANSETRON HCL 4 MG/2ML IJ SOLN
INTRAMUSCULAR | Status: DC | PRN
  Administered 2021-01-24: 4 mg via INTRAVENOUS

## 2021-01-24 MED ORDER — PROPOFOL 10 MG/ML IV BOLUS
INTRAVENOUS | Status: AC
  Filled 2021-01-24: qty 20

## 2021-01-24 MED ORDER — MEPERIDINE HCL 50 MG/ML IJ SOLN: 6.2500 mg | INTRAMUSCULAR | Status: DC | PRN

## 2021-01-24 MED ORDER — DEXMEDETOMIDINE (PRECEDEX) IN NS 20 MCG/5ML (4 MCG/ML) IV SYRINGE
PREFILLED_SYRINGE | INTRAVENOUS | Status: DC | PRN
  Administered 2021-01-24 (×2): 8 ug via INTRAVENOUS

## 2021-01-24 MED ORDER — FENTANYL CITRATE (PF) 100 MCG/2ML IJ SOLN
INTRAMUSCULAR | Status: DC | PRN
  Administered 2021-01-24: 100 ug via INTRAVENOUS

## 2021-01-24 MED ORDER — FENTANYL CITRATE (PF) 100 MCG/2ML IJ SOLN
INTRAMUSCULAR | Status: AC
  Filled 2021-01-24: qty 2

## 2021-01-24 MED ORDER — MIDAZOLAM HCL 2 MG/2ML IJ SOLN
INTRAMUSCULAR | Status: AC
  Filled 2021-01-24: qty 2

## 2021-01-24 MED ORDER — MIDAZOLAM HCL 5 MG/5ML IJ SOLN
INTRAMUSCULAR | Status: DC | PRN
  Administered 2021-01-24: 2 mg via INTRAVENOUS

## 2021-01-24 MED ORDER — FLUMAZENIL 0.5 MG/5ML IV SOLN
INTRAVENOUS | Status: DC | PRN
  Administered 2021-01-24: .5 mg via INTRAVENOUS

## 2021-01-24 MED ORDER — METOCLOPRAMIDE HCL 5 MG/ML IJ SOLN
INTRAMUSCULAR | Status: DC | PRN
  Administered 2021-01-24: 10 mg via INTRAVENOUS

## 2021-01-24 MED ORDER — DEXAMETHASONE SODIUM PHOSPHATE 10 MG/ML IJ SOLN
INTRAMUSCULAR | Status: AC
  Filled 2021-01-24: qty 1

## 2021-01-24 MED ORDER — SODIUM CHLORIDE 0.9 % IR SOLN
Status: DC | PRN
  Administered 2021-01-24: 1000 mL
  Administered 2021-01-24: 3000 mL

## 2021-01-24 MED ORDER — HYDROMORPHONE HCL 1 MG/ML IJ SOLN: 0.2500 mg | INTRAMUSCULAR | Status: DC | PRN

## 2021-01-24 MED ORDER — LIDOCAINE HCL (CARDIAC) PF 100 MG/5ML IV SOSY
PREFILLED_SYRINGE | INTRAVENOUS | Status: DC | PRN
  Administered 2021-01-24: 100 mg via INTRATRACHEAL

## 2021-01-24 MED ORDER — DEXAMETHASONE SODIUM PHOSPHATE 10 MG/ML IJ SOLN
INTRAMUSCULAR | Status: DC | PRN
  Administered 2021-01-24: 5 mg via INTRAVENOUS

## 2021-01-24 MED ORDER — ONDANSETRON HCL 4 MG/2ML IJ SOLN: 4.0000 mg | Freq: Once | INTRAMUSCULAR | Status: DC | PRN

## 2021-01-24 MED ORDER — HEPARIN SODIUM (PORCINE) 5000 UNIT/ML IJ SOLN: 5000.0000 [IU] | Freq: Three times a day (TID) | INTRAMUSCULAR | Status: DC

## 2021-01-24 MED ORDER — BUPIVACAINE-EPINEPHRINE 0.5% -1:200000 IJ SOLN
INTRAMUSCULAR | Status: DC | PRN
  Administered 2021-01-24: 20 mL

## 2021-01-24 MED ORDER — LACTATED RINGERS IV SOLN: INTRAVENOUS | Status: DC | PRN

## 2021-01-24 MED ORDER — METOCLOPRAMIDE HCL 5 MG/ML IJ SOLN
INTRAMUSCULAR | Status: AC
  Filled 2021-01-24: qty 2

## 2021-01-24 MED ORDER — BUPIVACAINE-EPINEPHRINE (PF) 0.5% -1:200000 IJ SOLN
INTRAMUSCULAR | Status: AC
  Filled 2021-01-24: qty 30

## 2021-01-24 MED ORDER — ONDANSETRON HCL 4 MG/2ML IJ SOLN
INTRAMUSCULAR | Status: AC
  Filled 2021-01-24: qty 2

## 2021-01-24 SURGICAL SUPPLY — 40 items
BANDAGE ELASTIC 3 VELCRO ST LF (GAUZE/BANDAGES/DRESSINGS) ×2 IMPLANT
BANDAGE ELASTIC 6 VELCRO ST LF (GAUZE/BANDAGES/DRESSINGS) ×2 IMPLANT
BANDAGE ESMARK 4X12 BL STRL LF (DISPOSABLE) IMPLANT
BNDG CONFORM 2 STRL LF (GAUZE/BANDAGES/DRESSINGS) IMPLANT
BNDG ELASTIC 3X5.8 VLCR NS LF (GAUZE/BANDAGES/DRESSINGS) IMPLANT
BNDG ELASTIC 6X5.8 VLCR NS LF (GAUZE/BANDAGES/DRESSINGS) ×2 IMPLANT
BNDG ESMARK 4X12 BLUE STRL LF (DISPOSABLE)
CLOTH BEACON ORANGE TIMEOUT ST (SAFETY) ×2 IMPLANT
COVER LIGHT HANDLE STERIS (MISCELLANEOUS) ×4 IMPLANT
CUFF TOURN SGL QUICK 18X4 (TOURNIQUET CUFF) IMPLANT
DRAPE SURG 17X23 STRL (DRAPES) ×2 IMPLANT
DRSG XEROFORM 1X8 (GAUZE/BANDAGES/DRESSINGS) ×2 IMPLANT
ELECT REM PT RETURN 9FT ADLT (ELECTROSURGICAL) ×2
ELECTRODE REM PT RTRN 9FT ADLT (ELECTROSURGICAL) ×1 IMPLANT
GAUZE PACKING IODOFORM 1/2 (PACKING) IMPLANT
GAUZE SPONGE 4X4 12PLY STRL (GAUZE/BANDAGES/DRESSINGS) ×4 IMPLANT
GAUZE SPONGE 4X4 16PLY XRAY LF (GAUZE/BANDAGES/DRESSINGS) ×2 IMPLANT
GLOVE SRG 8 PF TXTR STRL LF DI (GLOVE) ×2 IMPLANT
GLOVE SURG POLYISO LF SZ8 (GLOVE) ×4 IMPLANT
GLOVE SURG UNDER POLY LF SZ7 (GLOVE) ×4 IMPLANT
GLOVE SURG UNDER POLY LF SZ8 (GLOVE) ×2
GOWN STRL REUS W/ TWL XL LVL3 (GOWN DISPOSABLE) ×1 IMPLANT
GOWN STRL REUS W/TWL LRG LVL3 (GOWN DISPOSABLE) ×2 IMPLANT
GOWN STRL REUS W/TWL XL LVL3 (GOWN DISPOSABLE) ×1
IV NS IRRIG 3000ML ARTHROMATIC (IV SOLUTION) ×2 IMPLANT
KIT TURNOVER KIT A (KITS) ×2 IMPLANT
MANIFOLD NEPTUNE II (INSTRUMENTS) ×2 IMPLANT
NS IRRIG 1000ML POUR BTL (IV SOLUTION) ×2 IMPLANT
PACK BASIC LIMB (CUSTOM PROCEDURE TRAY) ×2 IMPLANT
PAD ABD 5X9 TENDERSORB (GAUZE/BANDAGES/DRESSINGS) ×4 IMPLANT
PAD ARMBOARD 7.5X6 YLW CONV (MISCELLANEOUS) ×2 IMPLANT
PADDING CAST COTTON 6X4 STRL (CAST SUPPLIES) ×2 IMPLANT
PADDING WEBRIL 4 STERILE (GAUZE/BANDAGES/DRESSINGS) ×4 IMPLANT
SET BASIN LINEN APH (SET/KITS/TRAYS/PACK) ×2 IMPLANT
SET CYSTO W/LG BORE CLAMP LF (SET/KITS/TRAYS/PACK) ×2 IMPLANT
SPONGE T-LAP 18X18 ~~LOC~~+RFID (SPONGE) ×2 IMPLANT
SUT ETHILON 3 0 FSL (SUTURE) ×2 IMPLANT
SWAB CULTURE ESWAB REG 1ML (MISCELLANEOUS) ×2 IMPLANT
SWAB CULTURE LIQ STUART DBL (MISCELLANEOUS) ×2 IMPLANT
SYR BULB IRRIG 60ML STRL (SYRINGE) ×2 IMPLANT

## 2021-01-24 NOTE — Transfer of Care (Signed)
Immediate Anesthesia Transfer of Care Note  Patient: Johnathan Johnson  Procedure(s) Performed: INCISION AND DRAINAGE LEFT THIGH (Left)  Patient Location: PACU  Anesthesia Type:General  Level of Consciousness: awake, alert , oriented, sedated, drowsy and patient cooperative  Airway & Oxygen Therapy: Patient Spontanous Breathing  Post-op Assessment: Report given to RN and Post -op Vital signs reviewed and stable  Post vital signs: Reviewed and stable  Last Vitals:  Vitals Value Taken Time  BP 135/86 01/24/21 1245  Temp 36.4 C 01/24/21 1236  Pulse 76 01/24/21 1248  Resp 0 01/24/21 1248  SpO2 100 % 01/24/21 1248  Vitals shown include unvalidated device data.  Last Pain:  Vitals:   01/24/21 1236  TempSrc:   PainSc: 3       Patients Stated Pain Goal: 5 (01/24/21 1236)  Complications: No notable events documented.

## 2021-01-24 NOTE — Progress Notes (Signed)
PROGRESS NOTE    Johnathan Johnson  [id number] DOB: [birth year] DOA: 01/22/2021 PCP: Patient, No Pcp Per (Inactive)   Brief Narrative: Johnathan Johnson is a 45 y.o. male with a history of tobacco use and GERD. Patient presented secondary to knee pain and found to have evidence of cellulitis and associated sepsis on admission. Empiric antibiotics started and cultures obtained. Clinically improving. MRI suggests developing abscess. Orthopedic surgery consulted and plan for I&D in the OR on 12/10.   Assessment & Plan:   * Sepsis ([place]) Present on admission. Secondary to left leg cellulitis. Empiric Vancomycin and Ceftriaxone initiated on admission. Blood cultures obtained and are pending. Tmax of 103.1 F on admission. Associated leukocytosis which has resolved. -Continue Vancomycin/Ceftriaxone and follow up blood cultures  Cellulitis Left leg. Initial concern for abscess. I&D performed in ED with drainage sent for culture and no growth so far. MRI obtained which showed a medially located left upper knee fluid collection. Orthopedic surgery consulted and plan for I&D in OR today -Antibiotics as mentioned above -Await culture from repeat I&D  Medial meniscus tear Unsure of chronicity. Noted on MRI. Seems to be an incidental finding. Patient can follow-up with orthopedic surgery as an outpatient.  Tobacco use disorder Counseled on admission. Declined nicotine patch  Hypoalbuminemia Dietitian evaluated with diagnosis of "Increased nutrient needs related to wound healing as evidenced by estimated needs." -Dietitian recommendations (12/9): Ensure Max daily (150 kcal, 30 gr protein) 1 packet Juven BID- to support wound healing  Multivitamin daily  GERD (gastroesophageal reflux disease) -Continue Protonix    DVT prophylaxis: Heparin subq Code Status:   Code Status: Full Code Family Communication: Wife at bedside Disposition Plan: Discharge home likely in 1-2 days pending orthopedic  surgery recommendations, new wound culture data, transition to oral antibiotics   Consultants:  Orthopedic surgery  Procedures:  I&D (12/8)  Antimicrobials: Vancomycin Ceftriaxone    Subjective: Continues to feel better. He has noticed more purulent expression from left knee wound.   Objective: Vitals:   01/22/21 2050 01/23/21 0558 01/23/21 2032 01/24/21 0626  BP: 121/83 (!) 135/91 (!) 159/95 (!) 159/99  Pulse: 75 81 100 88  Resp: 18 18 19    Temp: 98.8 F (37.1 C) 98.9 F (37.2 C) 98.2 F (36.8 C) 98.3 F (36.8 C)  TempSrc: Oral Oral  Oral  SpO2: 96% 99% 94% 99%  Weight:      Height:        Intake/Output Summary (Last 24 hours) at 01/24/2021 1021 Last data filed at 01/24/2021 0600 Gross per 24 hour  Intake 772.35 ml  Output --  Net 772.35 ml   Filed Weights   01/22/21 0103 01/22/21 0429  Weight: 86.2 kg 86.2 kg    Examination: General exam: Appears calm and comfortable and in no acute distress. Conversant Respiratory: Clear to auscultation. Respiratory effort normal with no intercostal retractions or use of accessory muscles Cardiovascular: S1 & S2 heard, RRR. No murmurs, rubs, gallops or clicks. 1+ LLE edema Gastrointestinal: Abdomen is non-distended, soft and non-tender. No masses felt. Normal bowel sounds heard Neurologic: No focal neurological deficits Musculoskeletal: No calf tenderness Skin: No cyanosis. No new rashes. Left knee with significant erythema over patella and migrating medially and proximally. Purulent drainage noted from knee wound. No tenderness of left knee and no significant warmth. Psychiatry: Alert and oriented. Memory intact. Mood & affect appropriate   Data Reviewed: I have personally reviewed following labs and imaging studies  CBC Lab Results  Component Value  Date   WBC 10.0 01/24/2021   RBC 3.53 (L) 01/24/2021   HGB 10.2 (L) 01/24/2021   HCT 31.7 (L) 01/24/2021   MCV 89.8 01/24/2021   MCH 28.9 01/24/2021   PLT 272  01/24/2021   MCHC 32.2 01/24/2021   RDW 13.5 01/24/2021   LYMPHSABS 2.4 01/22/2021   MONOABS 1.7 (H) 01/22/2021   EOSABS 0.0 01/22/2021   BASOSABS 0.1 0000000     Last metabolic panel Lab Results  Component Value Date   NA 138 01/22/2021   K 3.6 01/22/2021   CL 105 01/22/2021   CO2 27 01/22/2021   BUN 12 01/22/2021   CREATININE 0.86 01/23/2021   GLUCOSE 126 (H) 01/22/2021   GFRNONAA >60 01/23/2021   GFRAA >60 12/25/2015   CALCIUM 7.8 (L) 01/22/2021   PROT 5.9 (L) 01/22/2021   ALBUMIN 2.6 (L) 01/22/2021   BILITOT 1.1 01/22/2021   ALKPHOS 52 01/22/2021   AST 12 (L) 01/22/2021   ALT 10 01/22/2021   ANIONGAP 6 01/22/2021    CBG (last 3)  No results for input(s): GLUCAP in the last 72 hours.   GFR: Estimated Creatinine Clearance: 114.3 mL/min (by C-G formula based on SCr of 0.86 mg/dL).  Coagulation Profile: Recent Labs  Lab 01/22/21 0130 01/22/21 0525  INR 1.3* 1.4*    Recent Results (from the past 240 hour(s))  Resp Panel by RT-PCR (Flu A&B, Covid) Nasopharyngeal Swab     Status: None   Collection Time: 01/22/21  1:33 AM   Specimen: Nasopharyngeal Swab; Nasopharyngeal(NP) swabs in vial transport medium  Result Value Ref Range Status   SARS Coronavirus 2 by RT PCR NEGATIVE NEGATIVE Final    Comment: (NOTE) SARS-CoV-2 target nucleic acids are NOT DETECTED.  The SARS-CoV-2 RNA is generally detectable in upper respiratory specimens during the acute phase of infection. The lowest concentration of SARS-CoV-2 viral copies this assay can detect is 138 copies/mL. A negative result does not preclude SARS-Cov-2 infection and should not be used as the sole basis for treatment or other patient management decisions. A negative result may occur with  improper specimen collection/handling, submission of specimen other than nasopharyngeal swab, presence of viral mutation(s) within the areas targeted by this assay, and inadequate number of viral copies(<138 copies/mL). A  negative result must be combined with clinical observations, patient history, and epidemiological information. The expected result is Negative.  Fact Sheet for Patients:  EntrepreneurPulse.com.au  Fact Sheet for Healthcare Providers:  IncredibleEmployment.be  This test is no t yet approved or cleared by the [place] FDA and  has been authorized for detection and/or diagnosis of SARS-CoV-2 by FDA under an Emergency Use Authorization (EUA). This EUA will remain  in effect (meaning this test can be used) for the duration of the COVID-19 declaration under Section 564(b)(1) of the Act, 21 U.S.C.section 360bbb-3(b)(1), unless the authorization is terminated  or revoked sooner.       Influenza A by PCR NEGATIVE NEGATIVE Final   Influenza B by PCR NEGATIVE NEGATIVE Final    Comment: (NOTE) The Xpert Xpress SARS-CoV-2/FLU/RSV plus assay is intended as an aid in the diagnosis of influenza from Nasopharyngeal swab specimens and should not be used as a sole basis for treatment. Nasal washings and aspirates are unacceptable for Xpert Xpress SARS-CoV-2/FLU/RSV testing.  Fact Sheet for Patients: EntrepreneurPulse.com.au  Fact Sheet for Healthcare Providers: IncredibleEmployment.be  This test is not yet approved or cleared by the [place] FDA and has been authorized for detection and/or diagnosis of SARS-CoV-2  by FDA under an Emergency Use Authorization (EUA). This EUA will remain in effect (meaning this test can be used) for the duration of the COVID-19 declaration under Section 564(b)(1) of the Act, 21 U.S.C. section 360bbb-3(b)(1), unless the authorization is terminated or revoked.  Performed at [facility], [address] [place], [place] [address]   Blood Culture (routine x 2)     Status: None (Preliminary result)   Collection Time: 01/22/21  1:47 AM   Specimen: Right Antecubital; Blood  Result Value  Ref Range Status   Specimen Description RIGHT ANTECUBITAL  Final   Special Requests   Final    BOTTLES DRAWN AEROBIC AND ANAEROBIC Blood Culture adequate volume   Culture   Final    NO GROWTH 2 DAYS Performed at [facility], [address], [place], [place] [address]    Report Status PENDING  Incomplete  Blood Culture (routine x 2)     Status: None (Preliminary result)   Collection Time: 01/22/21  1:47 AM   Specimen: Left Antecubital; Blood  Result Value Ref Range Status   Specimen Description LEFT ANTECUBITAL  Final   Special Requests   Final    BOTTLES DRAWN AEROBIC AND ANAEROBIC Blood Culture results may not be optimal due to an excessive volume of blood received in culture bottles   Culture   Final    NO GROWTH 2 DAYS Performed at [facility], [address] [place], [place] [address]    Report Status PENDING  Incomplete  Aerobic Culture w Gram Stain (superficial specimen)     Status: None (Preliminary result)   Collection Time: 01/22/21  2:14 AM   Specimen: KNEE  Result Value Ref Range Status   Specimen Description   Final    KNEE Performed at [facility], [address], [place], [place] [address]    Special Requests   Final    NONE Performed at [facility], [address] [place], [place] [address]    Gram Stain   Final    RARE WBC PRESENT, PREDOMINANTLY PMN NO ORGANISMS SEEN    Culture   Final    FEW STAPHYLOCOCCUS AUREUS CULTURE REINCUBATED FOR BETTER GROWTH Performed at [facility], [address] [place], [place] [address]    Report Status PENDING  Incomplete  Urine Culture     Status: None   Collection Time: 01/22/21  2:53 AM   Specimen: Urine, Clean Catch  Result Value Ref Range Status   Specimen Description   Final    URINE, CLEAN CATCH Performed at [facility], [address] [place], [place] [address]    Special Requests   Final    NONE Performed at [facility], [address] [place], [place] [address]    Culture   Final    NO  GROWTH Performed at [facility], [address], [place], [place] [address]    Report Status 01/23/2021 FINAL  Final  Surgical pcr screen     Status: Abnormal   Collection Time: 01/23/21  3:43 PM   Specimen: Nasal Mucosa; Nasal Swab  Result Value Ref Range Status   MRSA, PCR POSITIVE (A) NEGATIVE Final    Comment: RESULT CALLED TO, READ BACK BY AND VERIFIED WITH: JACKSON,N AT 1721 ON 12.9.22 BY RUCINSKI,B    Staphylococcus aureus POSITIVE (A) NEGATIVE Final    Comment: RESULT CALLED TO, READ BACK BY AND VERIFIED WITH: JACKSON,N AT 1721 ON 12.9.22 BY RUCINSKI,B (NOTE) The Xpert SA Assay (FDA  approved for NASAL specimens in patients 28 years of age and older), is one component of a comprehensive surveillance program. It is not intended to diagnose infection nor to guide or monitor treatment. Performed at [facility], [address] [place], [place] [address]          Radiology Studies: MR KNEE LEFT W WO CONTRAST  Result Date: 01/22/2021 CLINICAL DATA:  Septic arthritis EXAM: MRI OF THE LEFT KNEE WITHOUT AND WITH CONTRAST TECHNIQUE: Multiplanar, multisequence MR imaging of the knee was performed. No intravenous contrast was administered. COMPARISON:  Radiographs 01/22/2021 FINDINGS: MENISCI Medial meniscus: Grade 3 oblique tear of the posterior horn and midbody involving the inferior surface on image 19 series 11, with some mild free edge truncation in the midbody. Lateral meniscus:  Unremarkable LIGAMENTS Cruciates:  Unremarkable Collaterals:  Unremarkable CARTILAGE Patellofemoral: 6 mm non-fragmented osteochondral lesion of the posterior femoral trochlear groove slightly laterally eccentric, with a degenerative subcortical cystic lesion and adjacent overlying chondral edema and chondral fissuring as shown on images 13-14 of series 13 and also on images 15 through 18 of series 9. 3 mm non-fragmented osteochondral lesion inferiorly along the lateral patellar facet with surrounding  marrow edema but intact overlying articular cartilage. Medial: Small degenerative subcortical foci of edema along the medial femoral condyle with associated moderate chondral thinning. Marginal spurring. Lateral: 3 by 9 mm sagittally oriented chondral defect centrally along the posterior weight-bearing portion of the lateral femoral condyle, images 17-19 of series 11 and also shown on image 21 series 12. Joint: Small knee effusion with a small amount of synovitis along the suprapatellar bursa. We do not see the degree of synovial enhancement that I would expect for septic joint. Axial T1 weighted images both pre and postcontrast demonstrate bright fluid which is not typical for T1 weighted images and suggest some towards sort of artifact or distortion of the images. The coronal T1 weighted images appear normal with dark fluid in the joint as expected. Popliteal Fossa:  Small Baker's cyst. Extensor Mechanism: There is abnormal edema and enhancement in the more medial portion of the distal vastus medialis as shown for example on image 4 series 13 and image 19 series 10 which could be from muscle tear or myositis. Deeper portions appear spared. Bones:  No additional significant bony findings. Other: Substantial subcutaneous edema and enhancement suggesting cellulitis. Superficial to the vastus medialis and along the medial margin of the patella we demonstrate a 3.7 by 1.8 by 3.6 cm (volume = 13 cm^3) fluid signal intensity collection with some enhancement along its margins, suspicious for incipient abscess. Portions of this extend only mm deep to the scan, correlate with any fluctuance in this region. IMPRESSION: 1. There is only a small knee effusion with mild synovitis, with relatively thin degree of enhancement along the synovial margin, making septic joint unlikely. However, there is a irregular 13 mm subcutaneous collection just medial to the patella with surrounding infiltrative enhancement in the subcutaneous  tissues concerning for incipient subcutaneous abscess. 2. There is also abnormal edema and enhancement in the adjacent superficial portion of distal vastus medialis which could be from muscle strain or myositis. 3. Grade 3 oblique tear posterior horn and midbody medial meniscus. 4. Small osteochondral lesions along the patellofemoral joint and lateral compartment. Mild degenerative chondral thinning. 5. Small Baker's cyst. 6. Substantial subcutaneous edema and enhancement especially anteromedially along the distal thigh, favoring cellulitis. Electronically Signed   By: Gaylyn Rong M.D.   On: 01/22/2021 16:26  Scheduled Meds:  [MAR Hold] acetaminophen  650 mg Oral Once   [MAR Hold] Chlorhexidine Gluconate Cloth  6 each Topical Q0600   [MAR Hold] heparin  5,000 Units Subcutaneous Q8H   [MAR Hold] multivitamin with minerals  1 tablet Oral Daily   [MAR Hold] mupirocin ointment  1 application Nasal BID   [MAR Hold] pantoprazole  40 mg Oral Daily   [MAR Hold] polyethylene glycol  17 g Oral Daily   [MAR Hold] Ensure Max Protein  11 oz Oral Daily   Continuous Infusions:  [MAR Hold] cefTRIAXone (ROCEPHIN)  IV 2 g (01/24/21 0155)   [MAR Hold] vancomycin 1,500 mg (01/24/21 0418)     LOS: 2 days     Cordelia Poche, MD [facility] 01/24/2021, 10:21 AM  If 7PM-7AM, please contact night-coverage [email or web address]

## 2021-01-24 NOTE — Progress Notes (Signed)
ORTHOPAEDIC PROGRESS NOTE  Scheduled for Procedure(s): INCISION AND DRAINAGE LEFT THIGH  DOS: 01/24/21  SUBJECTIVE: No issues over night.  Some night sweats.  Left thigh continues to drain purulent material   OBJECTIVE: PE:  Alert and oriented, no acute distress.   Left thigh, medial swelling and redness.  Improved compared to yesterday.  Purulent drainage Fluctuance Tender to palpation Tolerates left knee range of motion.   Vitals:   01/23/21 2032 01/24/21 0626  BP: (!) 159/95 (!) 159/99  Pulse: 100 88  Resp: 19   Temp: 98.2 F (36.8 C) 98.3 F (36.8 C)  SpO2: 94% 99%     ASSESSMENT: Johnathan Johnson is a 45 y.o. male stable, ready for surgery.  NPO since midnight.  PLAN: Weightbearing: WBAT LLE Insicional and dressing care: Reinforce dressings as needed Orthopedic device(s): None VTE prophylaxis: None currently, please hold until POD#1 Pain control: PRN medications, judicious use of narcotics Follow - up plan: 2 weeks postop   Contact information:     Joliene Salvador A. Dallas Schimke, MD MS [facility] [address] [place],  [place]  [address] Phone: [phone] Fax: [phone]

## 2021-01-24 NOTE — Anesthesia Postprocedure Evaluation (Signed)
Anesthesia Post Note  Patient: Octavio Graves  Procedure(s) Performed: INCISION AND DRAINAGE LEFT THIGH (Left)  Patient location during evaluation: PACU Anesthesia Type: General Level of consciousness: awake and alert and oriented Pain management: pain level controlled Vital Signs Assessment: post-procedure vital signs reviewed and stable Respiratory status: spontaneous breathing, nonlabored ventilation and respiratory function stable Cardiovascular status: blood pressure returned to baseline and stable Postop Assessment: no apparent nausea or vomiting Anesthetic complications: no   No notable events documented.   Last Vitals:  Vitals:   01/24/21 1236 01/24/21 1245  BP: (!) 142/87 135/86  Pulse: 85 86  Resp: (!) 30 18  Temp: 36.4 C   SpO2: 100% 98%    Last Pain:  Vitals:   01/24/21 1236  TempSrc:   PainSc: 3                  Deanne Bedgood C Adelaido Nicklaus

## 2021-01-24 NOTE — Anesthesia Procedure Notes (Signed)
Procedure Name: LMA Insertion Date/Time: 01/24/2021 11:43 AM Performed by: Molli Barrows, MD Pre-anesthesia Checklist: Patient identified, Emergency Drugs available, Suction available and Patient being monitored Patient Re-evaluated:Patient Re-evaluated prior to induction Oxygen Delivery Method: Circle system utilized Preoxygenation: Pre-oxygenation with 100% oxygen Induction Type: IV induction LMA Size: 4.0 Tube type: Oral Number of attempts: 1 Tube secured with: Tape

## 2021-01-24 NOTE — Plan of Care (Signed)

## 2021-01-24 NOTE — Op Note (Signed)
Orthopaedic Surgery Operative Note (CSN: [id number])  Johnathan Johnson  08/27/1975 Date of Surgery: 01/22/2021 - 01/24/2021   Diagnoses:  Left Thigh Abscess  Procedure: Incision and drainage of left distal thigh abscess   Operative Finding Successful completion of the planned procedure.  Culture swab and specimen sent for aerobic and anaerobic culture.  Penrose drain left in place.    Post-Op Diagnosis: Same Surgeons:Primary: Johnathan Barre, MD Assistants: None Location: [facility] OR ROOM 3 Anesthesia: General with local anesthesia Antibiotics:  Patient has been receiving regular antibiotics on the floor.  No additional antibiotics use during the case. Tourniquet time: N/A Estimated Blood Loss: 100 cc Complications: None Specimens: Culture swab and deep tissue specimen sent for culture.  Anaerobic and aerobic. Implants: * No implants in log *  Indications for Surgery:   Johnathan Johnson is a 45 y.o. male who developed a distal and medial thigh abscess over the last 1-2 weeks.  Limited I&D in the ED was unsuccessful.  He was improving on IV antibiotics, but an MRI demonstrated a developing fluid collection.  He was spontaneously draining, but this was very slow. Benefits and risks of operative and nonoperative management were discussed prior to surgery with the patient and informed consent form was completed.  Specific risks including infection, need for additional surgery, bleeding, recurrence of infection, damage to surrounding structures and more severe complications associated with anesthesia.  He elected proceed.  Surgical consent was finalized.   Procedure:   The patient was identified properly. Informed consent was obtained and the surgical site was marked. The patient was taken up to suite where general anesthesia was induced.  The patient was positioned supine.  The left leg was prepped and draped in the usual sterile fashion.  Timeout was performed before the beginning of the  case.  There were 2 spots that were spontaneously draining.  In addition, there was some friable, clearly irritated tissue at the distal extent of the abscess.  We attempted to avoid this with our incision.  Longer's lines were clearly visible.  We made an approximately 6 cm incision, at the midpoint of the fluctuance, directly in line with Langer's lines.  We incised sharply through skin.  We dissected bluntly through subcutaneous tissue.  We immediately countered some purulent material.  At this point, we collected our culture swabs.  We then proceeded to dissect bluntly and express additional purulent material.  At the spontaneously draining wound, we encountered some thick purulent material.  This was collected and also sent for culture.  We debrided with a dry laparotomy sponge, and once again encountered some very thick purulent material.  We sent this for culture as well.  We then proceeded to irrigate the abscess cavity with 3 L of normal saline.  The cavity was debrided with a dry laparotomy sponge, and additional purulent material was removed with the sponge.  In total, the cavity was approximately 8 cm proximal from the incision, 6 cm distal to the incision, and an additional 1-2 cm medial and lateral to the incision.  There is no obvious remaining pockets or loculations of infectious material.  We then placed a Penrose drain, and closed the skin over top of the Penrose drain with 3-0 nylon.  We then injected 30 cc of 0.5% Marcaine with epinephrine surrounding the incision.  A dry dressing was placed, including Xeroform, gauze, multiple ABD pads and Webril, followed by an Ace wrap.  Patient was awoken taken to PACU in stable condition.  Post-operative plan:  The patient will be returned to the floor. I will remove the Penrose drain in approximately 48-72 hours. Continue with the current regimen of antibiotics, tailored based on the culture analysis.  Can also transition to a p.o. antibiotic. DVT  prophylaxis per primary team, no orthopedic contraindications.    Pain control with PRN pain medication preferring oral medicines.   Follow up plan will be scheduled in approximately 3-5 days for incision check and change.

## 2021-01-25 LAB — AEROBIC CULTURE W GRAM STAIN (SUPERFICIAL SPECIMEN)

## 2021-01-25 MED ORDER — MUPIROCIN 2 % EX OINT: 1.0000 "application " | TOPICAL_OINTMENT | Freq: Two times a day (BID) | CUTANEOUS | Status: AC

## 2021-01-25 MED ORDER — ADULT MULTIVITAMIN W/MINERALS CH: 1.0000 | ORAL_TABLET | Freq: Every day | ORAL | Status: AC

## 2021-01-25 MED ORDER — DOXYCYCLINE HYCLATE 100 MG PO TABS: 100.0000 mg | ORAL_TABLET | Freq: Two times a day (BID) | ORAL | 0 refills | Status: AC

## 2021-01-25 NOTE — Discharge Instructions (Signed)
Johnathan Johnson,  You were in the hospital with a knee skin infection and associated abscess. This required antibiotics and a surgery to remove the bacterial fluid. You have significantly improved. Please continue the antibiotics as prescribed and follow-up with the orthopedic surgeon on Tuesday as directed.

## 2021-01-25 NOTE — Discharge Summary (Addendum)
Physician Discharge Summary  ROGUE RAFALSKI ONG:[id number] DOB: 1975-07-08 DOA: 01/22/2021  PCP: Patient, No Pcp Per (Inactive)  Admit date: 01/22/2021 Discharge date: 01/25/2021  Admitted From: Home Disposition: Home  Recommendations for Outpatient Follow-up:  Follow up with orthopedic surgery for wound check and drain removal on 12/13 Please obtain BMP/CBC in one week Please follow up on the following pending results: None  Home Health: None Equipment/Devices: None  Discharge Condition: Stable CODE STATUS: Full code Diet recommendation: Regular diet   Brief/Interim Summary:  Admission HPI written by [place], DO    HPI:    Zerick Prevette  is a 45 y.o. male, with history of tobacco use disorder and acid reflux presents ED with a chief complaint of knee pain.  Patient reports that he had a tiny bump on his knee approximately 6 days ago.  He mash on it twice, with a small amount of purulent drainage.  3 days later he noticed erythema and edema of the knee.  Reports that onset of pain was also 3 days ago.  He describes the pain as burning and throbbing.  The pain is constant, but worse with weightbearing and bending his leg.  He tried ibuprofen but it did not relieve the pain.  He denies any fever, chills, sweats at home.  He was febrile when he came into the ED.  Patient reports otherwise he has been in his normal state of health.  He does admit to some constipation.  His last normal bowel movement was yesterday.  He normally goes every day. He reports that lately its been every other day.  He reports he has been drinking plenty of water, but does not eat much fiber in his diet.  He is not on any opiate at home.  Patient has normal appetite.  He reports 1 episode of emesis that was related to pain when expressing fluid from the knee yesterday.  He reports he got a decent amount of purulent fluid but cannot quantify how much.  He reports that today when he came into the ER  the wound was draining.  The color of these different drainages have been golden, green, and brown.  Patient has no other complaints at this time.   Patient is a current smoker a pack a day.  He declines nicotine patch at this time.  He does not drink alcohol.  He does not use illicit drugs.  He is vaccinated for COVID.  Patient is full code.  Hospital course:  * Sepsis [facility]) Present on admission. Secondary to left leg cellulitis. Empiric Vancomycin and Ceftriaxone initiated on admission. Tmax of 103.1 F on admission. Associated leukocytosis which has resolved. Blood culture with no growth at time of discharge.  Cellulitis Left leg. Initial concern for abscess. I&D performed in ED with drainage sent for culture and no growth so far. MRI obtained which showed a medially located left upper knee fluid collection. Orthopedic surgery consulted and patient underwent successful I&D in the OR on 12/10. Wound culture significant for MRSA, sensitive to doxycycline. Patient transitioned from Vancomycin/Ceftriaxone to doxycycline on discharge.  Medial meniscus tear Unsure of chronicity. Noted on MRI. Seems to be an incidental finding. Patient can follow-up with orthopedic surgery as an outpatient.  Tobacco use disorder Counseled on admission. Declined nicotine patch  Hypoalbuminemia Dietitian evaluated with diagnosis of "Increased nutrient needs related to wound healing as evidenced by estimated needs." Dietitian consulted with recommendation for nutrition supplementation and a daily multivitamin.  GERD (  gastroesophageal reflux disease) Continue Protonix   Discharge Diagnoses:  Principal Problem:   Sepsis ([place]) Active Problems:   Cellulitis   SIRS (systemic inflammatory response syndrome) (HCC)   GERD (gastroesophageal reflux disease)   Hypoalbuminemia   Tobacco use disorder   Medial meniscus tear    Discharge Instructions   Allergies as of 01/25/2021       Reactions   Haldol  [haloperidol Lactate]    "body locked up."        Medication List     TAKE these medications    doxycycline 100 MG tablet Commonly known as: VIBRA-TABS Take 1 tablet (100 mg total) by mouth 2 (two) times daily for 7 days.   multivitamin with minerals Tabs tablet Take 1 tablet by mouth daily. Start taking on: January 26, 2021   mupirocin ointment 2 % Commonly known as: BACTROBAN Place 1 application into the nose 2 (two) times daily for 4 days.   pantoprazole 20 MG tablet Commonly known as: PROTONIX Take 1 tablet (20 mg total) by mouth daily.               Discharge Care Instructions  (From admission, onward)           Start     Ordered   01/25/21 0000  Leave dressing on - Keep it clean, dry, and intact until clinic visit        01/25/21 1253            Follow-up Information     Mordecai Rasmussen, MD. Go on 01/27/2021.   Specialties: Orthopedic Surgery, Sports Medicine Why: For wound re-check Contact information: [id number]. [address] [place] [address] [phone]                Allergies  Allergen Reactions   Haldol [Haloperidol Lactate]     "body locked up."     Consultations: Orthopedic surgery   Procedures/Studies: DG Knee 2 Views Left  Result Date: 01/22/2021 CLINICAL DATA:  Pimple like bump on left knee with increased redness, initial encounter EXAM: LEFT KNEE - 2 VIEW COMPARISON:  None. FINDINGS: No acute fracture or dislocation is noted. The known soft tissue lesion is noted just above the patella on the lateral film. No bony erosive changes are seen. IMPRESSION: Changes consistent with the known soft tissue abnormality. No bony abnormality is seen. Electronically Signed   By: Inez Catalina M.D.   On: 01/22/2021 02:18   MR KNEE LEFT W WO CONTRAST  Result Date: 01/22/2021 CLINICAL DATA:  Septic arthritis EXAM: MRI OF THE LEFT KNEE WITHOUT AND WITH CONTRAST TECHNIQUE: Multiplanar, multisequence MR imaging of the knee was performed.  No intravenous contrast was administered. COMPARISON:  Radiographs 01/22/2021 FINDINGS: MENISCI Medial meniscus: Grade 3 oblique tear of the posterior horn and midbody involving the inferior surface on image 19 series 11, with some mild free edge truncation in the midbody. Lateral meniscus:  Unremarkable LIGAMENTS Cruciates:  Unremarkable Collaterals:  Unremarkable CARTILAGE Patellofemoral: 6 mm non-fragmented osteochondral lesion of the posterior femoral trochlear groove slightly laterally eccentric, with a degenerative subcortical cystic lesion and adjacent overlying chondral edema and chondral fissuring as shown on images 13-14 of series 13 and also on images 15 through 18 of series 9. 3 mm non-fragmented osteochondral lesion inferiorly along the lateral patellar facet with surrounding marrow edema but intact overlying articular cartilage. Medial: Small degenerative subcortical foci of edema along the medial femoral condyle with associated moderate chondral thinning. Marginal spurring. Lateral: 3 by  9 mm sagittally oriented chondral defect centrally along the posterior weight-bearing portion of the lateral femoral condyle, images 17-19 of series 11 and also shown on image 21 series 12. Joint: Small knee effusion with a small amount of synovitis along the suprapatellar bursa. We do not see the degree of synovial enhancement that I would expect for septic joint. Axial T1 weighted images both pre and postcontrast demonstrate bright fluid which is not typical for T1 weighted images and suggest some towards sort of artifact or distortion of the images. The coronal T1 weighted images appear normal with dark fluid in the joint as expected. Popliteal Fossa:  Small Baker's cyst. Extensor Mechanism: There is abnormal edema and enhancement in the more medial portion of the distal vastus medialis as shown for example on image 4 series 13 and image 19 series 10 which could be from muscle tear or myositis. Deeper portions  appear spared. Bones:  No additional significant bony findings. Other: Substantial subcutaneous edema and enhancement suggesting cellulitis. Superficial to the vastus medialis and along the medial margin of the patella we demonstrate a 3.7 by 1.8 by 3.6 cm (volume = 13 cm^3) fluid signal intensity collection with some enhancement along its margins, suspicious for incipient abscess. Portions of this extend only mm deep to the scan, correlate with any fluctuance in this region. IMPRESSION: 1. There is only a small knee effusion with mild synovitis, with relatively thin degree of enhancement along the synovial margin, making septic joint unlikely. However, there is a irregular 13 mm subcutaneous collection just medial to the patella with surrounding infiltrative enhancement in the subcutaneous tissues concerning for incipient subcutaneous abscess. 2. There is also abnormal edema and enhancement in the adjacent superficial portion of distal vastus medialis which could be from muscle strain or myositis. 3. Grade 3 oblique tear posterior horn and midbody medial meniscus. 4. Small osteochondral lesions along the patellofemoral joint and lateral compartment. Mild degenerative chondral thinning. 5. Small Baker's cyst. 6. Substantial subcutaneous edema and enhancement especially anteromedially along the distal thigh, favoring cellulitis. Electronically Signed   By: Van Clines M.D.   On: 01/22/2021 16:26   DG Chest Port 1 View  Result Date: 01/22/2021 CLINICAL DATA:  Possible sepsis EXAM: PORTABLE CHEST 1 VIEW COMPARISON:  12/25/2015 FINDINGS: The heart size and mediastinal contours are within normal limits. Both lungs are clear. The visualized skeletal structures are unremarkable. IMPRESSION: No active disease. Electronically Signed   By: Inez Catalina M.D.   On: 01/22/2021 02:17     Subjective: No significant issues overnight. Leg feels fine.  Discharge Exam: Vitals:   01/25/21 0022 01/25/21 0500  BP:  135/85 127/80  Pulse: 78 69  Resp:  18  Temp: 98.9 F (37.2 C) 98 F (36.7 C)  SpO2: 100% 99%   Vitals:   01/24/21 1632 01/24/21 2029 01/25/21 0022 01/25/21 0500  BP: (!) 141/93 133/77 135/85 127/80  Pulse: 75 67 78 69  Resp: 18 18  18   Temp: 98.1 F (36.7 C) 98.1 F (36.7 C) 98.9 F (37.2 C) 98 F (36.7 C)  TempSrc: Oral  Oral Oral  SpO2: 98% 100% 100% 99%  Weight:      Height:        General: Pt is alert, awake, not in acute distress Cardiovascular: RRR, S1/S2 +, no rubs, no gallops Respiratory: CTA bilaterally, no wheezing, no rhonchi Abdominal: Soft, NT, ND, bowel sounds + Extremities: mild LLE edema, no cyanosis. Left knee with ACE bandaged dressing.  The results of significant diagnostics from this hospitalization (including imaging, microbiology, ancillary and laboratory) are listed below for reference.     Microbiology: Recent Results (from the past 240 hour(s))  Resp Panel by RT-PCR (Flu A&B, Covid) Nasopharyngeal Swab     Status: None   Collection Time: 01/22/21  1:33 AM   Specimen: Nasopharyngeal Swab; Nasopharyngeal(NP) swabs in vial transport medium  Result Value Ref Range Status   SARS Coronavirus 2 by RT PCR NEGATIVE NEGATIVE Final    Comment: (NOTE) SARS-CoV-2 target nucleic acids are NOT DETECTED.  The SARS-CoV-2 RNA is generally detectable in upper respiratory specimens during the acute phase of infection. The lowest concentration of SARS-CoV-2 viral copies this assay can detect is 138 copies/mL. A negative result does not preclude SARS-Cov-2 infection and should not be used as the sole basis for treatment or other patient management decisions. A negative result may occur with  improper specimen collection/handling, submission of specimen other than nasopharyngeal swab, presence of viral mutation(s) within the areas targeted by this assay, and inadequate number of viral copies(<138 copies/mL). A negative result must be combined with clinical  observations, patient history, and epidemiological information. The expected result is Negative.  Fact Sheet for Patients:  EntrepreneurPulse.com.au  Fact Sheet for Healthcare Providers:  IncredibleEmployment.be  This test is no t yet approved or cleared by the [place] FDA and  has been authorized for detection and/or diagnosis of SARS-CoV-2 by FDA under an Emergency Use Authorization (EUA). This EUA will remain  in effect (meaning this test can be used) for the duration of the COVID-19 declaration under Section 564(b)(1) of the Act, 21 U.S.C.section 360bbb-3(b)(1), unless the authorization is terminated  or revoked sooner.       Influenza A by PCR NEGATIVE NEGATIVE Final   Influenza B by PCR NEGATIVE NEGATIVE Final    Comment: (NOTE) The Xpert Xpress SARS-CoV-2/FLU/RSV plus assay is intended as an aid in the diagnosis of influenza from Nasopharyngeal swab specimens and should not be used as a sole basis for treatment. Nasal washings and aspirates are unacceptable for Xpert Xpress SARS-CoV-2/FLU/RSV testing.  Fact Sheet for Patients: EntrepreneurPulse.com.au  Fact Sheet for Healthcare Providers: IncredibleEmployment.be  This test is not yet approved or cleared by the [place] FDA and has been authorized for detection and/or diagnosis of SARS-CoV-2 by FDA under an Emergency Use Authorization (EUA). This EUA will remain in effect (meaning this test can be used) for the duration of the COVID-19 declaration under Section 564(b)(1) of the Act, 21 U.S.C. section 360bbb-3(b)(1), unless the authorization is terminated or revoked.  Performed at [facility], [address], [place], [place] [address]   Blood Culture (routine x 2)     Status: None (Preliminary result)   Collection Time: 01/22/21  1:47 AM   Specimen: Right Antecubital; Blood  Result Value Ref Range Status   Specimen Description RIGHT  ANTECUBITAL  Final   Special Requests   Final    BOTTLES DRAWN AEROBIC AND ANAEROBIC Blood Culture adequate volume   Culture   Final    NO GROWTH 3 DAYS Performed at [facility], [address], [place], [place] [address]    Report Status PENDING  Incomplete  Blood Culture (routine x 2)     Status: None (Preliminary result)   Collection Time: 01/22/21  1:47 AM   Specimen: Left Antecubital; Blood  Result Value Ref Range Status   Specimen Description LEFT ANTECUBITAL  Final   Special Requests   Final  BOTTLES DRAWN AEROBIC AND ANAEROBIC Blood Culture results may not be optimal due to an excessive volume of blood received in culture bottles   Culture   Final    NO GROWTH 3 DAYS Performed at [facility], [address] [place], [place] [address]    Report Status PENDING  Incomplete  Aerobic Culture w Gram Stain (superficial specimen)     Status: None   Collection Time: 01/22/21  2:14 AM   Specimen: KNEE  Result Value Ref Range Status   Specimen Description   Final    KNEE Performed at [facility], [address] [place], [place] [address]    Special Requests   Final    NONE Performed at [facility], [address] [place], [place] [address]    Gram Stain   Final    RARE WBC PRESENT, PREDOMINANTLY PMN NO ORGANISMS SEEN    Culture   Final    FEW METHICILLIN RESISTANT STAPHYLOCOCCUS AUREUS WITHIN MIXED ORGANISMS Performed at [facility], [address] [place], [place] [address]    Report Status 01/25/2021 FINAL  Final   Organism ID, Bacteria METHICILLIN RESISTANT STAPHYLOCOCCUS AUREUS  Final      Susceptibility   Methicillin resistant staphylococcus aureus - MIC*    CIPROFLOXACIN >=8 RESISTANT Resistant     ERYTHROMYCIN >=8 RESISTANT Resistant     GENTAMICIN <=0.5 SENSITIVE Sensitive     OXACILLIN >=4 RESISTANT Resistant     TETRACYCLINE <=1 SENSITIVE Sensitive     VANCOMYCIN 1 SENSITIVE Sensitive     TRIMETH/SULFA 80 RESISTANT Resistant     CLINDAMYCIN  <=0.25 SENSITIVE Sensitive     RIFAMPIN <=0.5 SENSITIVE Sensitive     Inducible Clindamycin NEGATIVE Sensitive     * FEW METHICILLIN RESISTANT STAPHYLOCOCCUS AUREUS  Urine Culture     Status: None   Collection Time: 01/22/21  2:53 AM   Specimen: Urine, Clean Catch  Result Value Ref Range Status   Specimen Description   Final    URINE, CLEAN CATCH Performed at [facility], [address] [place], [place] [address]    Special Requests   Final    NONE Performed at [facility], [address] [place], [place] [address]    Culture   Final    NO GROWTH Performed at [facility], [address] [place], [place] [address]    Report Status 01/23/2021 FINAL  Final  Surgical pcr screen     Status: Abnormal   Collection Time: 01/23/21  3:43 PM   Specimen: Nasal Mucosa; Nasal Swab  Result Value Ref Range Status   MRSA, PCR POSITIVE (A) NEGATIVE Final    Comment: RESULT CALLED TO, READ BACK BY AND VERIFIED WITH: JACKSON,N AT 1721 ON 12.9.22 BY RUCINSKI,B    Staphylococcus aureus POSITIVE (A) NEGATIVE Final    Comment: RESULT CALLED TO, READ BACK BY AND VERIFIED WITH: JACKSON,N AT 1721 ON 12.9.22 BY RUCINSKI,B (NOTE) The Xpert SA Assay (FDA approved for NASAL specimens in patients 46 years of age and older), is one component of a comprehensive surveillance program. It is not intended to diagnose infection nor to guide or monitor treatment. Performed at [facility], [address] [place], [place] [address]   Aerobic/Anaerobic Culture w Gram Stain (surgical/deep wound)     Status: None (Preliminary result)   Collection Time: 01/24/21  1:36 PM   Specimen: PATH Other; Tissue  Result Value Ref Range Status   Specimen Description   Final  TISSUE SITE A LEFT THIGH DRAINAGE TISSUE Performed at [facility], [address], [place], [place] [address]    Special Requests   Final    NONE Performed at [facility], [address], [place], [place] [address]    Gram Stain   Final     FEW WBC PRESENT, PREDOMINANTLY PMN FEW GRAM POSITIVE COCCI IN PAIRS Performed at [facility], [place] [address], [place], [place] [address]    Culture PENDING  Incomplete   Report Status PENDING  Incomplete  Aerobic Culture w Gram Stain (superficial specimen)     Status: None (Preliminary result)   Collection Time: 01/24/21  1:39 PM   Specimen: Wound  Result Value Ref Range Status   Specimen Description WOUND LEFT THIGH  Final   Special Requests ID B AEROBIC SWAB  Final   Gram Stain   Final    FEW WBC PRESENT, PREDOMINANTLY MONONUCLEAR FEW GRAM POSITIVE COCCI IN PAIRS FEW GRAM POSITIVE COCCI IN CLUSTERS Performed at [facility], [place] [address], [place], [place] [address]    Culture PENDING  Incomplete   Report Status PENDING  Incomplete     Labs: BNP (last 3 results) No results for input(s): BNP in the last 8760 hours. Basic Metabolic Panel: Recent Labs  Lab 01/22/21 0130 01/22/21 0525 01/23/21 0608  NA 132* 138  --   K 3.7 3.6  --   CL 98 105  --   CO2 24 27  --   GLUCOSE 131* 126*  --   BUN 14 12  --   CREATININE 0.88 0.95 0.86  CALCIUM 8.0* 7.8*  --   MG  --  1.8  --    Liver Function Tests: Recent Labs  Lab 01/22/21 0130 01/22/21 0525  AST 15 12*  ALT 12 10  ALKPHOS 64 52  BILITOT 1.2 1.1  PROT 7.1 5.9*  ALBUMIN 3.2* 2.6*   No results for input(s): LIPASE, AMYLASE in the last 168 hours. No results for input(s): AMMONIA in the last 168 hours. CBC: Recent Labs  Lab 01/22/21 0130 01/22/21 0525 01/23/21 0608 01/24/21 0818  WBC 19.1* 18.0* 15.2* 10.0  NEUTROABS 15.2* 13.7*  --   --   HGB 11.6* 9.8* 9.9* 10.2*  HCT 35.5* 31.3* 31.2* 31.7*  MCV 87.7 88.9 89.7 89.8  PLT 304 230 234 272   Cardiac Enzymes: No results for input(s): CKTOTAL, CKMB, CKMBINDEX, TROPONINI in the last 168 hours. BNP: Invalid input(s): POCBNP CBG: No results for input(s): GLUCAP in the last 168 hours. D-Dimer No results for input(s): DDIMER in the last 72  hours. Hgb A1c No results for input(s): HGBA1C in the last 72 hours. Lipid Profile No results for input(s): CHOL, HDL, LDLCALC, TRIG, CHOLHDL, LDLDIRECT in the last 72 hours. Thyroid function studies No results for input(s): TSH, T4TOTAL, T3FREE, THYROIDAB in the last 72 hours.  Invalid input(s): FREET3 Anemia work up No results for input(s): VITAMINB12, FOLATE, FERRITIN, TIBC, IRON, RETICCTPCT in the last 72 hours. Urinalysis    Component Value Date/Time   COLORURINE YELLOW 01/22/2021 0253   APPEARANCEUR CLEAR 01/22/2021 0253   LABSPEC 1.010 01/22/2021 0253   PHURINE 6.0 01/22/2021 0253   GLUCOSEU NEGATIVE 01/22/2021 0253   HGBUR TRACE (A) 01/22/2021 0253   BILIRUBINUR NEGATIVE 01/22/2021 0253   KETONESUR NEGATIVE 01/22/2021 0253   PROTEINUR NEGATIVE 01/22/2021 0253   NITRITE NEGATIVE 01/22/2021 0253   LEUKOCYTESUR NEGATIVE 01/22/2021 0253   Sepsis Labs Invalid input(s): PROCALCITONIN,  WBC,  [place] [facility] Recent Results (from the past 240 hour(s))  Resp Panel by RT-PCR (Flu A&B, Covid) Nasopharyngeal Swab     Status: None   Collection Time: 01/22/21  1:33 AM   Specimen: Nasopharyngeal Swab; Nasopharyngeal(NP) swabs in vial transport medium  Result Value Ref Range Status   SARS Coronavirus 2 by RT PCR NEGATIVE NEGATIVE Final    Comment: (NOTE) SARS-CoV-2 target nucleic acids are NOT DETECTED.  The SARS-CoV-2 RNA is generally detectable in upper respiratory specimens during the acute phase of infection. The lowest concentration of SARS-CoV-2 viral copies this assay can detect is 138 copies/mL. A negative result does not preclude SARS-Cov-2 infection and should not be used as the sole basis for treatment or other patient management decisions. A negative result may occur with  improper specimen collection/handling, submission of specimen other than nasopharyngeal swab, presence of viral mutation(s) within the areas targeted by this assay, and inadequate number  of viral copies(<138 copies/mL). A negative result must be combined with clinical observations, patient history, and epidemiological information. The expected result is Negative.  Fact Sheet for Patients:  EntrepreneurPulse.com.au  Fact Sheet for Healthcare Providers:  IncredibleEmployment.be  This test is no t yet approved or cleared by the [place] FDA and  has been authorized for detection and/or diagnosis of SARS-CoV-2 by FDA under an Emergency Use Authorization (EUA). This EUA will remain  in effect (meaning this test can be used) for the duration of the COVID-19 declaration under Section 564(b)(1) of the Act, 21 U.S.C.section 360bbb-3(b)(1), unless the authorization is terminated  or revoked sooner.       Influenza A by PCR NEGATIVE NEGATIVE Final   Influenza B by PCR NEGATIVE NEGATIVE Final    Comment: (NOTE) The Xpert Xpress SARS-CoV-2/FLU/RSV plus assay is intended as an aid in the diagnosis of influenza from Nasopharyngeal swab specimens and should not be used as a sole basis for treatment. Nasal washings and aspirates are unacceptable for Xpert Xpress SARS-CoV-2/FLU/RSV testing.  Fact Sheet for Patients: EntrepreneurPulse.com.au  Fact Sheet for Healthcare Providers: IncredibleEmployment.be  This test is not yet approved or cleared by the [place] FDA and has been authorized for detection and/or diagnosis of SARS-CoV-2 by FDA under an Emergency Use Authorization (EUA). This EUA will remain in effect (meaning this test can be used) for the duration of the COVID-19 declaration under Section 564(b)(1) of the Act, 21 U.S.C. section 360bbb-3(b)(1), unless the authorization is terminated or revoked.  Performed at [facility], [address], [place], [place] [address]   Blood Culture (routine x 2)     Status: None (Preliminary result)   Collection Time: 01/22/21  1:47 AM   Specimen:  Right Antecubital; Blood  Result Value Ref Range Status   Specimen Description RIGHT ANTECUBITAL  Final   Special Requests   Final    BOTTLES DRAWN AEROBIC AND ANAEROBIC Blood Culture adequate volume   Culture   Final    NO GROWTH 3 DAYS Performed at [facility], [address], [place], [place] [address]    Report Status PENDING  Incomplete  Blood Culture (routine x 2)     Status: None (Preliminary result)   Collection Time: 01/22/21  1:47 AM   Specimen: Left Antecubital; Blood  Result Value Ref Range Status   Specimen Description LEFT ANTECUBITAL  Final   Special Requests   Final    BOTTLES DRAWN AEROBIC AND ANAEROBIC Blood Culture results may not be optimal due to an excessive volume of blood received in  culture bottles   Culture   Final    NO GROWTH 3 DAYS Performed at [facility], [address] [place], [place] [address]    Report Status PENDING  Incomplete  Aerobic Culture w Gram Stain (superficial specimen)     Status: None   Collection Time: 01/22/21  2:14 AM   Specimen: KNEE  Result Value Ref Range Status   Specimen Description   Final    KNEE Performed at [facility], [address], [place], [place] [address]    Special Requests   Final    NONE Performed at [facility], [address] [place], [place] [address]    Gram Stain   Final    RARE WBC PRESENT, PREDOMINANTLY PMN NO ORGANISMS SEEN    Culture   Final    FEW METHICILLIN RESISTANT STAPHYLOCOCCUS AUREUS WITHIN MIXED ORGANISMS Performed at [facility], [address], [place], [place] [address]    Report Status 01/25/2021 FINAL  Final   Organism ID, Bacteria METHICILLIN RESISTANT STAPHYLOCOCCUS AUREUS  Final      Susceptibility   Methicillin resistant staphylococcus aureus - MIC*    CIPROFLOXACIN >=8 RESISTANT Resistant     ERYTHROMYCIN >=8 RESISTANT Resistant     GENTAMICIN <=0.5 SENSITIVE Sensitive     OXACILLIN >=4 RESISTANT Resistant     TETRACYCLINE <=1 SENSITIVE Sensitive      VANCOMYCIN 1 SENSITIVE Sensitive     TRIMETH/SULFA 80 RESISTANT Resistant     CLINDAMYCIN <=0.25 SENSITIVE Sensitive     RIFAMPIN <=0.5 SENSITIVE Sensitive     Inducible Clindamycin NEGATIVE Sensitive     * FEW METHICILLIN RESISTANT STAPHYLOCOCCUS AUREUS  Urine Culture     Status: None   Collection Time: 01/22/21  2:53 AM   Specimen: Urine, Clean Catch  Result Value Ref Range Status   Specimen Description   Final    URINE, CLEAN CATCH Performed at [facility], [address] [place], [place] [address]    Special Requests   Final    NONE Performed at [facility], [address], [place], [place] [address]    Culture   Final    NO GROWTH Performed at [facility], [address], [place], [place] [address]    Report Status 01/23/2021 FINAL  Final  Surgical pcr screen     Status: Abnormal   Collection Time: 01/23/21  3:43 PM   Specimen: Nasal Mucosa; Nasal Swab  Result Value Ref Range Status   MRSA, PCR POSITIVE (A) NEGATIVE Final    Comment: RESULT CALLED TO, READ BACK BY AND VERIFIED WITH: JACKSON,N AT 1721 ON 12.9.22 BY RUCINSKI,B    Staphylococcus aureus POSITIVE (A) NEGATIVE Final    Comment: RESULT CALLED TO, READ BACK BY AND VERIFIED WITH: JACKSON,N AT 1721 ON 12.9.22 BY RUCINSKI,B (NOTE) The Xpert SA Assay (FDA approved for NASAL specimens in patients 66 years of age and older), is one component of a comprehensive surveillance program. It is not intended to diagnose infection nor to guide or monitor treatment. Performed at [facility], [address], [place], [place] [address]   Aerobic/Anaerobic Culture w Gram Stain (surgical/deep wound)     Status: None (Preliminary result)   Collection Time: 01/24/21  1:36 PM   Specimen: PATH Other; Tissue  Result Value Ref Range Status   Specimen Description   Final    TISSUE SITE A LEFT THIGH DRAINAGE TISSUE Performed at [facility], [address], [place], [place] [address]  Special Requests   Final     NONE Performed at [facility], [address] [place], [place] [address]    Gram Stain   Final    FEW WBC PRESENT, PREDOMINANTLY PMN FEW GRAM POSITIVE COCCI IN PAIRS Performed at [facility], [place] [address], [place], [place] [address]    Culture PENDING  Incomplete   Report Status PENDING  Incomplete  Aerobic Culture w Gram Stain (superficial specimen)     Status: None (Preliminary result)   Collection Time: 01/24/21  1:39 PM   Specimen: Wound  Result Value Ref Range Status   Specimen Description WOUND LEFT THIGH  Final   Special Requests ID B AEROBIC SWAB  Final   Gram Stain   Final    FEW WBC PRESENT, PREDOMINANTLY MONONUCLEAR FEW GRAM POSITIVE COCCI IN PAIRS FEW GRAM POSITIVE COCCI IN CLUSTERS Performed at [facility], [place] [address], [place], [place] [address]    Culture PENDING  Incomplete   Report Status PENDING  Incomplete     Time coordinating discharge: 35 minutes  SIGNED:   Cordelia Poche, MD [facility] 01/25/2021, 12:50 PM

## 2021-01-25 NOTE — Progress Notes (Signed)
ORTHOPAEDIC PROGRESS NOTE  S/p INCISION AND DRAINAGE LEFT THIGH  DOS: 01/24/21  SUBJECTIVE: Pain is controlled.  No fevers or chills.  He is comfortable going home.    OBJECTIVE: PE:  Alert and oriented, no acute distress.   Left thigh dressing is intact.  Small amount of drainage visible.  Tolerates left knee range of motion.   Vitals:   01/25/21 0022 01/25/21 0500  BP: 135/85 127/80  Pulse: 78 69  Resp:  18  Temp: 98.9 F (37.2 C) 98 F (36.7 C)  SpO2: 100% 99%     ASSESSMENT: Johnathan Johnson is a 45 y.o. male stable, ok to DC from orthopaedics perspective  PLAN: Weightbearing: WBAT LLE Insicional and dressing care: Reinforce dressings as needed Orthopedic device(s): None VTE prophylaxis: None currently, please hold until POD#1 Pain control: PRN medications, judicious use of narcotics Follow - up plan: 12/13 in clinic to remove penrose drain.  Can monitor cultures and change as needed.    Contact information:     Saniyya Gau A. Dallas Schimke, MD MS [facility] [address] [place],  [place]  [address] Phone: [phone] Fax: [phone]

## 2021-01-25 NOTE — Plan of Care (Signed)

## 2021-01-26 ENCOUNTER — Encounter (HOSPITAL_COMMUNITY): Payer: Self-pay | Admitting: Orthopedic Surgery

## 2021-01-27 ENCOUNTER — Encounter: Payer: Self-pay | Admitting: Orthopedic Surgery

## 2021-01-27 ENCOUNTER — Other Ambulatory Visit: Payer: Self-pay

## 2021-01-27 ENCOUNTER — Ambulatory Visit (INDEPENDENT_AMBULATORY_CARE_PROVIDER_SITE_OTHER): Payer: Self-pay | Admitting: Orthopedic Surgery

## 2021-01-27 VITALS — Ht 71.0 in | Wt 190.0 lb

## 2021-01-27 DIAGNOSIS — L02416 Cutaneous abscess of left lower limb: Secondary | ICD-10-CM

## 2021-01-27 LAB — AEROBIC CULTURE W GRAM STAIN (SUPERFICIAL SPECIMEN)

## 2021-01-27 LAB — CULTURE, BLOOD (ROUTINE X 2)
Culture: NO GROWTH
Culture: NO GROWTH
Special Requests: ADEQUATE

## 2021-01-27 MED ORDER — TRAMADOL HCL 50 MG PO TABS: 50.0000 mg | ORAL_TABLET | Freq: Four times a day (QID) | ORAL | 0 refills | Status: AC | PRN

## 2021-01-27 NOTE — Progress Notes (Signed)
Orthopaedic Postop Note  Assessment: [name] is a 45 y.o. male s/p I&D left thigh abscess  DOS: 01/24/2021  Plan: Left medial, distal thigh abscess is improving.  Minimal redness appreciated on exam today.  Penrose drain was removed without issue.  Some serosanguineous drainage was noted.  Placed in a soft dressing.  Reviewed culture results, and there have been no updates.  Continue with the doxycycline.  We will contact him if we have to adjust any antibiotics.  I will see him back in approximately 1 week for repeat evaluation, and suture removal.  Continue with dressings as needed.  If the incision dries up, it is okay to leave it open to air.  Okay for him to shower.  Meds ordered this encounter  Medications   traMADol (ULTRAM) 50 MG tablet    Sig: Take 1 tablet (50 mg total) by mouth every 6 (six) hours as needed.    Dispense:  20 tablet    Refill:  0     Follow-up: Return in about 8 days (around 02/04/2021). XR at next visit: None  Subjective:  Chief Complaint  Patient presents with   Routine Post Op    Lt thigh I&D 01/24/21    History of Present Illness: Johnathan Johnson is a 45 y.o. male who presents following the above stated procedure.  Surgery was just a few days ago.  He presents today to have the Penrose drain removed, and completed dressing change.  He continues to have mild discomfort in the knee.  No fevers or chills.  Overall, he feels much better.  He has been taking the doxycycline without issue.  Review of Systems: No fevers or chills No numbness or tingling No Chest Pain No shortness of breath   Objective: Ht 5\' 11"  (1.803 m)    Wt 190 lb (86.2 kg)    BMI 26.50 kg/m   Physical Exam:  Alert and oriented.  No acute distress.  He walks with a mildly antalgic gait.  Evaluation of the left distal thigh demonstrates intact incision, with sutures in place.  Redness is significantly improved.  Minimal tenderness to palpation.  Upon removal of the  Penrose drain, there is a mild amount of serosanguineous drainage.  He tolerates gentle range of motion of his left knee.  Toes are warm and well-perfused.  Sensations intact over the dorsum of the foot.  IMAGING: I personally ordered and reviewed the following images:  No new imaging obtained today.  , MD 01/27/2021 9:56 AM

## 2021-01-27 NOTE — Patient Instructions (Signed)
Dressing as needed  No change to antibiotics at this point

## 2021-01-29 LAB — ANAEROBIC CULTURE W GRAM STAIN

## 2021-01-29 LAB — AEROBIC/ANAEROBIC CULTURE W GRAM STAIN (SURGICAL/DEEP WOUND)

## 2021-02-04 ENCOUNTER — Encounter: Payer: Self-pay | Admitting: Orthopedic Surgery

## 2023-03-04 IMAGING — DX DG CHEST 1V PORT
1 series · 1 of 1 positions shown · non-contrast
Comparison: 12/25/2015

CLINICAL DATA: Possible sepsis

EXAM:
PORTABLE CHEST 1 VIEW

[chest ap]
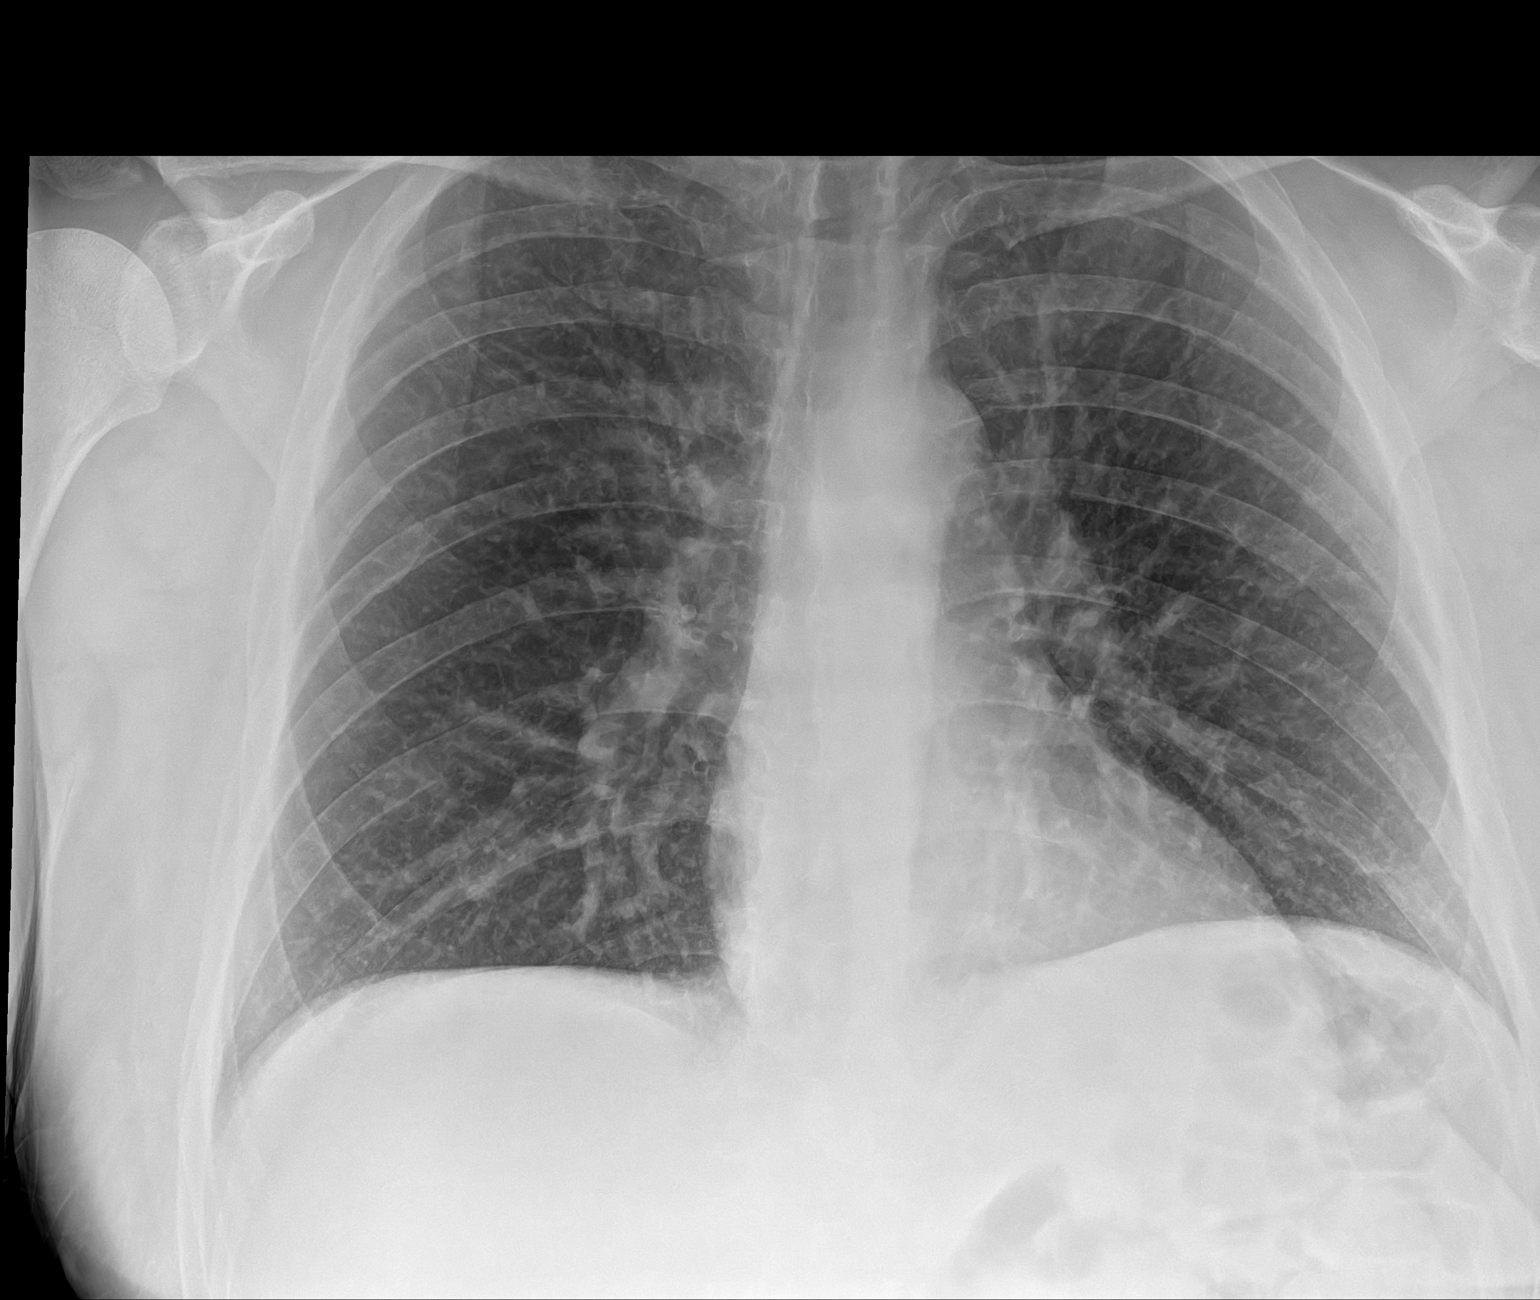

[1 of 1 positions shown; findings below may reference images not displayed]

FINDINGS: The heart size and mediastinal contours are within normal limits.
Both lungs are clear. The visualized skeletal structures are
unremarkable.
IMPRESSION: No active disease.

## 2023-03-04 IMAGING — MR MR KNEE*L* WO/W CM
9 series · 40 of 40 positions shown · IV contrast (9 ml Gadavist)
Comparison: Radiographs 01/22/2021

CLINICAL DATA: Septic arthritis

EXAM:
MRI OF THE LEFT KNEE WITHOUT AND WITH CONTRAST
TECHNIQUE: Multiplanar, multisequence MR imaging of the knee was performed. No
intravenous contrast was administered.

[Series 8: T1 · coronal · left · 4.0mm · 0.62mm/px · 5 of 30 slices shown]
[im 1/30]
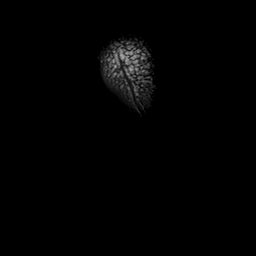
[im 8/30]
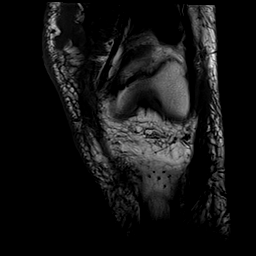
[im 15/30]
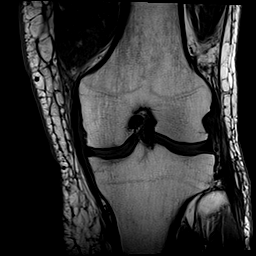
[im 22/30]
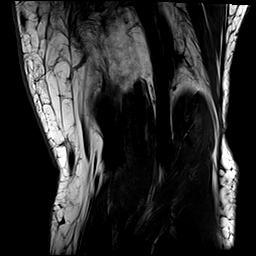
[im 30/30]
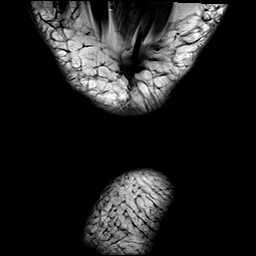

[Series 9: PD fat-sat · sagittal · left · 3.0mm · 0.66mm/px · 4 of 28 slices shown (1 of 2)]
[im 1/28]
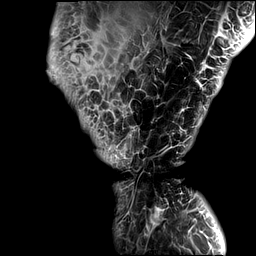
[im 10/28]
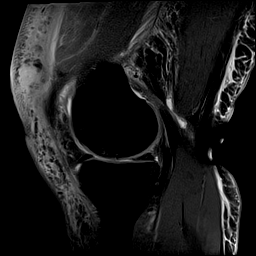
[im 19/28]
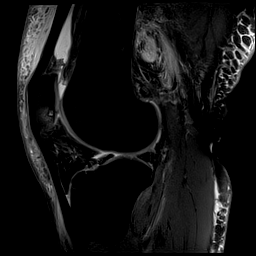
[im 28/28]
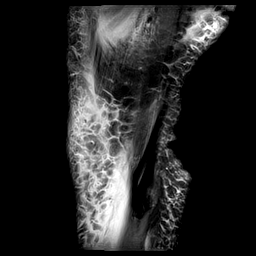

[Series 10: T2 fat-sat · coronal · left · 4.0mm · 0.62mm/px · 4 of 30 slices shown (1 of 3)]
[im 1/30]
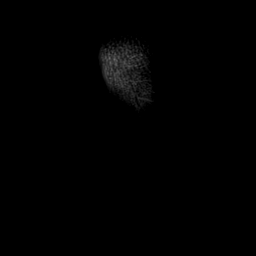
[im 10/30]
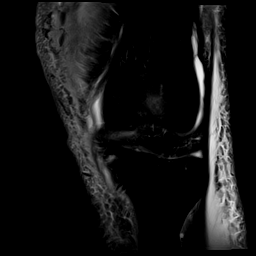
[im 20/30]
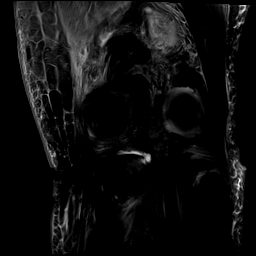
[im 30/30]
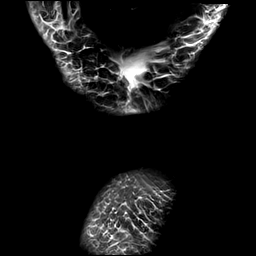

[Series 11: PD fat-sat · coronal · left · 3.0mm · 0.62mm/px · 6 of 40 slices shown (2 of 2)]
[im 1/40]
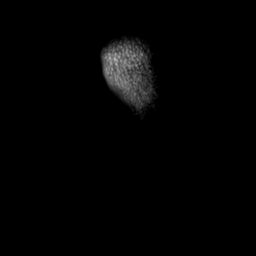
[im 8/40]
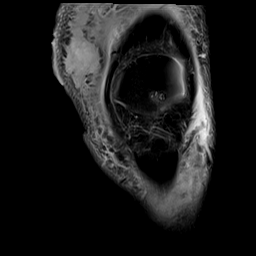
[im 16/40]
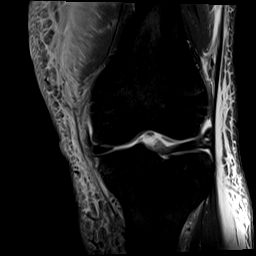
[im 24/40]
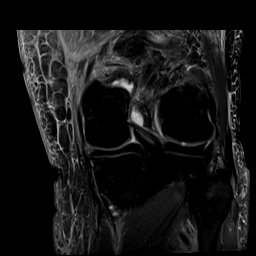
[im 32/40]
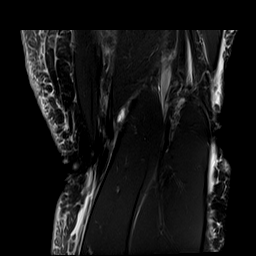
[im 40/40]
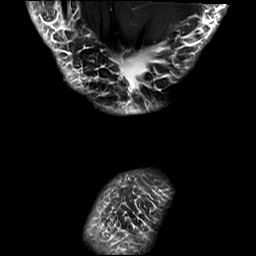

[Series 12: T2 fat-sat · sagittal · left · 3.0mm · 0.66mm/px · 4 of 28 slices shown (2 of 3)]
[im 1/28]
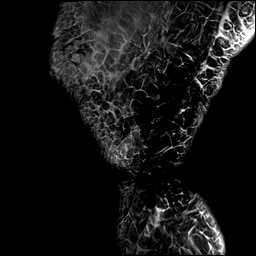
[im 10/28]
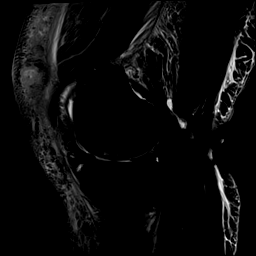
[im 19/28]
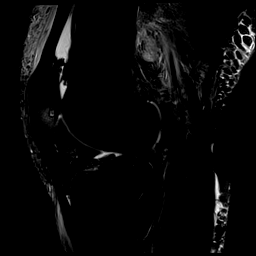
[im 28/28]
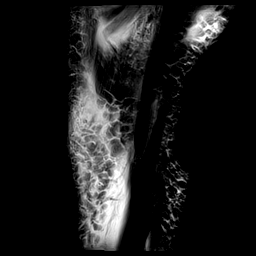

[Series 13: T2 fat-sat · axial · left · 5.0mm · 0.53mm/px · z∈[-97,+70]mm · 4 of 28 slices shown (3 of 3)]
[im 1/28]
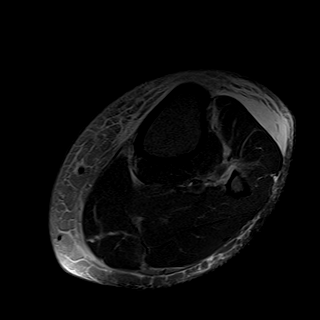
[im 10/28]
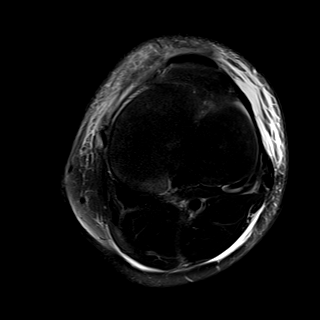
[im 19/28]
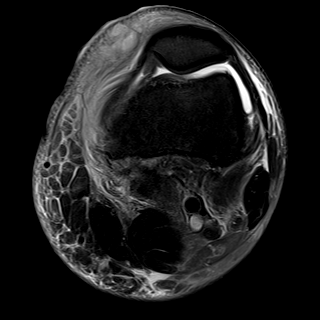
[im 28/28]
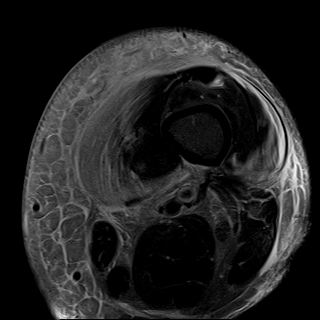

[Series 14: T1 fat-sat · axial · non-contrast · left · 5.0mm · 0.53mm/px · z∈[-97,+70]mm · 4 of 28 slices shown]
[im 1/28]
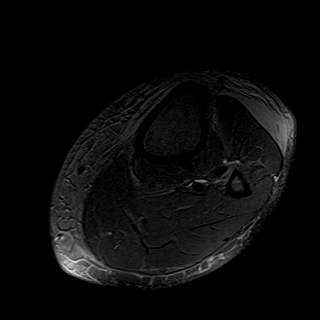
[im 10/28]
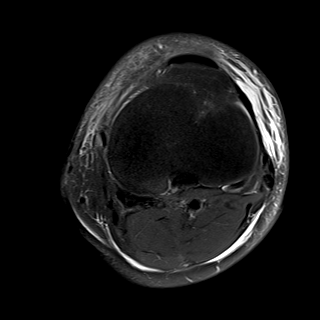
[im 19/28]
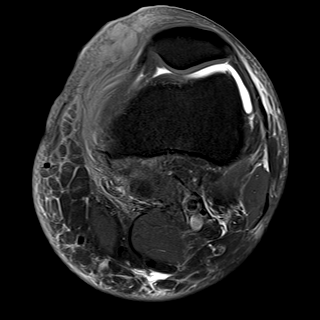
[im 28/28]
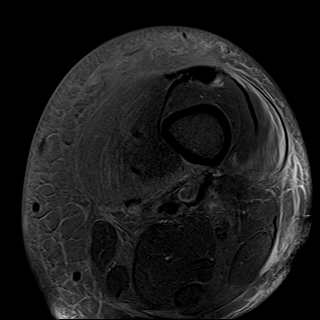

[Series 15: T1 fat-sat post-contrast · axial · left · 5.0mm · 0.53mm/px · z∈[-97,+70]mm · 4 of 28 slices shown (1 of 2)]
[im 1/28]
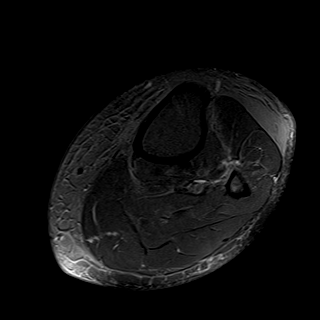
[im 10/28]
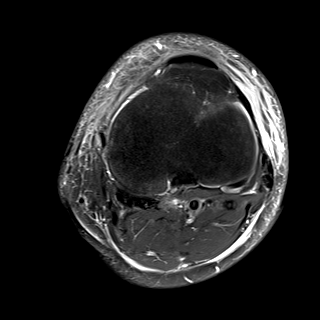
[im 19/28]
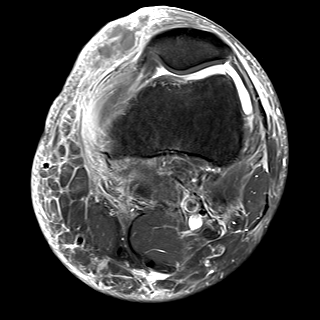
[im 28/28]
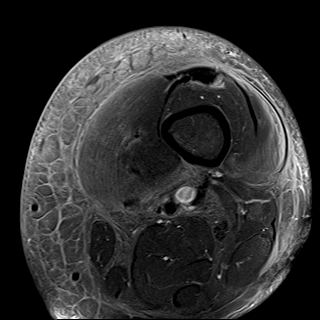

[Series 16: T1 fat-sat post-contrast · coronal · left · 4.0mm · 0.66mm/px · 5 of 32 slices shown (2 of 2)]
[im 1/32]
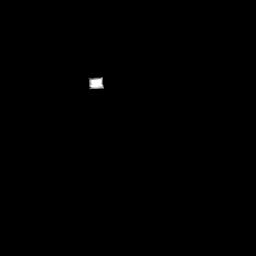
[im 8/32]
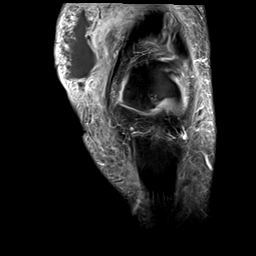
[im 16/32]
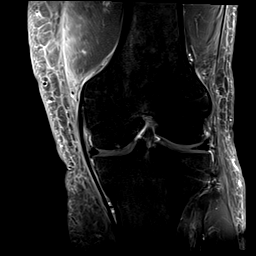
[im 24/32]
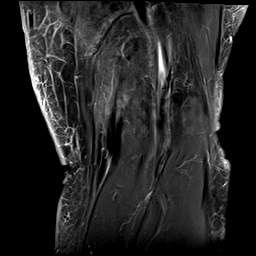
[im 32/32]
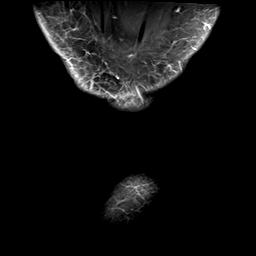

[40 of 40 positions shown; findings below may reference images not displayed]

FINDINGS: MENISCI

Medial meniscus: Grade 3 oblique tear of the posterior horn and
midbody involving the inferior surface on image 19 series 11, with
some mild free edge truncation in the midbody.

Lateral meniscus:  Unremarkable

LIGAMENTS

Cruciates:  Unremarkable

Collaterals:  Unremarkable

CARTILAGE

Patellofemoral: 6 mm non-fragmented osteochondral lesion of the
posterior femoral trochlear groove slightly laterally eccentric,
with a degenerative subcortical cystic lesion and adjacent overlying
chondral edema and chondral fissuring as shown on images 13-14 of
series 13 and also on images 15 through 18 of series 9. 3 mm
non-fragmented osteochondral lesion inferiorly along the lateral
patellar facet with surrounding marrow edema but intact overlying
articular cartilage.

Medial: Small degenerative subcortical foci of edema along the
medial femoral condyle with associated moderate chondral thinning.
Marginal spurring.

Lateral: 3 by 9 mm sagittally oriented chondral defect centrally
along the posterior weight-bearing portion of the lateral femoral
condyle, images 17-19 of series 11 and also shown on image 21 series
12.

Joint: Small knee effusion with a small amount of synovitis along
the suprapatellar bursa. We do not see the degree of synovial
enhancement that I would expect for septic joint. Axial T1 weighted
images both pre and postcontrast demonstrate bright fluid which is
not typical for T1 weighted images and suggest some towards sort of
artifact or distortion of the images. The coronal T1 weighted images
appear normal with dark fluid in the joint as expected.

Popliteal Fossa:  Small Baker's cyst.

Extensor Mechanism: There is abnormal edema and enhancement in the
more medial portion of the distal vastus medialis as shown for
example on image 4 series 13 and image 19 series 10 which could be
from muscle tear or myositis. Deeper portions appear spared.

Bones:  No additional significant bony findings.

Other: Substantial subcutaneous edema and enhancement suggesting
cellulitis. Superficial to the vastus medialis and along the medial
margin of the patella we demonstrate a 3.7 by 1.8 by 3.6 cm (volume
= 13 cm^3) fluid signal intensity collection with some enhancement
along its margins, suspicious for incipient abscess. Portions of
this extend only mm deep to the scan, correlate with any fluctuance
in this region.
IMPRESSION: 1. There is only a small knee effusion with mild synovitis, with
relatively thin degree of enhancement along the synovial margin,
making septic joint unlikely. However, there is a irregular 13 mm
subcutaneous collection just medial to the patella with surrounding
infiltrative enhancement in the subcutaneous tissues concerning for
incipient subcutaneous abscess.
2. There is also abnormal edema and enhancement in the adjacent
superficial portion of distal vastus medialis which could be from
muscle strain or myositis.
3. Grade 3 oblique tear posterior horn and midbody medial meniscus.
4. Small osteochondral lesions along the patellofemoral joint and
lateral compartment. Mild degenerative chondral thinning.
5. Small Baker's cyst.
6. Substantial subcutaneous edema and enhancement especially
anteromedially along the distal thigh, favoring cellulitis.
